# Patient Record
Sex: Female | Born: 1990 | Race: Black or African American | Hispanic: No | Marital: Single | Smoking: Current every day smoker
Health system: Southern US, Community
[De-identification: ages and names within clinical notes are randomized; demographics above are authoritative.]

## PROBLEM LIST (undated history)

## (undated) ENCOUNTER — Inpatient Hospital Stay (HOSPITAL_COMMUNITY): Payer: Self-pay

## (undated) DIAGNOSIS — O1414 Severe pre-eclampsia complicating childbirth: Secondary | ICD-10-CM

## (undated) DIAGNOSIS — O139 Gestational [pregnancy-induced] hypertension without significant proteinuria, unspecified trimester: Secondary | ICD-10-CM

## (undated) DIAGNOSIS — A749 Chlamydial infection, unspecified: Secondary | ICD-10-CM

## (undated) HISTORY — PX: WISDOM TOOTH EXTRACTION: SHX21

## (undated) HISTORY — DX: Severe pre-eclampsia complicating childbirth: O14.14

---
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
## 2011-01-01 DIAGNOSIS — N739 Female pelvic inflammatory disease, unspecified: Secondary | ICD-10-CM

## 2011-01-01 DIAGNOSIS — A5619 Other chlamydial genitourinary infection: Secondary | ICD-10-CM

## 2011-01-01 DIAGNOSIS — O98319 Other infections with a predominantly sexual mode of transmission complicating pregnancy, unspecified trimester: Secondary | ICD-10-CM

## 2011-01-01 DIAGNOSIS — O10019 Pre-existing essential hypertension complicating pregnancy, unspecified trimester: Secondary | ICD-10-CM

## 2011-01-01 LAB — PROTEIN, URINE, 24 HOUR
Protein, 24H Urine: 91 mg/d (ref 50–100)
Urine Total Volume-UPROT: 825 mL

## 2011-01-01 MED ORDER — LABETALOL HCL 200 MG PO TABS
400.0000 mg | ORAL_TABLET | Freq: Two times a day (BID) | ORAL | Status: DC
  Administered 2011-01-01 – 2011-01-02 (×3): 400 mg via ORAL
  Filled 2011-01-01 (×5): qty 2

## 2011-01-01 MED ORDER — LABETALOL HCL 5 MG/ML IV SOLN
20.0000 mg | Freq: Once | INTRAVENOUS | Status: AC
  Administered 2011-01-01: 20 mg via INTRAVENOUS
  Filled 2011-01-01: qty 4

## 2011-01-01 MED ORDER — ONDANSETRON 4 MG PO TBDP
4.0000 mg | ORAL_TABLET | Freq: Three times a day (TID) | ORAL | Status: DC | PRN
  Administered 2011-01-01: 4 mg via ORAL
  Filled 2011-01-01: qty 1

## 2011-01-01 NOTE — Progress Notes (Signed)
  No complaints of visual change, headache, swelling, with good fetal movement.  Filed Vitals:   01/01/11 0630  BP: 137/71  Pulse: 81  Temp:   Resp:    BP 165-173/80-96  NAD, pleasant Abd gravid not tender, soft Ext tr edema, DTR 1+/4+ Results for orders placed during the hospital encounter of 12/30/10 (from the past 24 hour(s))  CREATININE CLEARANCE, URINE, 24 HOUR     Status: Abnormal   Collection Time   12/31/10  6:52 PM      Component Value Range   Urine Total Volume-CRCL 825     Collection Interval-CRCL 24     Creatinine, Urine 175.49     Creatinine <0.47 (*) 0.50 - 1.10 (mg/dL)   Creatinine, 16X Ur 0960  700 - 1800 (mg/day)   Creatinine Clearance NOT CALCULATED  75 - 115 (mL/min)  PROTEIN, URINE, 24 HOUR     Status: Normal (Preliminary result)   Collection Time   12/31/10  7:00 PM      Component Value Range   Urine Total Volume-UPROT 825     Collection Interval-UPROT 24     Protein, Urine PENDING     Protein, 24H Urine 91  50 - 100 (mg/day)    Imp [redacted]w[redacted]d   HTN, R/O preeclampsia Labetalol incresed to 400mg  BID

## 2011-01-02 MED ORDER — LABETALOL HCL 300 MG PO TABS: 300.0000 mg | ORAL_TABLET | Freq: Three times a day (TID) | ORAL | Status: DC

## 2011-01-02 NOTE — Discharge Summary (Signed)
RN to the bedside to review discharge instructions with patient, pt. Verbalized understanding of discharge instructions.  Prescription given.

## 2011-01-02 NOTE — Discharge Summary (Signed)
Obstetric Discharge Summary Reason for Admission: Bp management , chronic HTN  Rule out superimposed Preeclampsi Prenatal Procedures: Preeclampsia workup Intrapartum Procedures:  Postpartum Procedures: none and n/a Complications-Operative and Postpartum:  Hemoglobin  Date Value Range Status  12/30/2010 11.5* 12.0-15.0 (g/dL) Final     HCT  Date Value Range Status  12/30/2010 34.0* 36.0-46.0 (%) Final    Discharge Diagnoses: pregnancy 36w6 days, Chronic Hypertension. Superimposed preeclampsia, ruled out  Discharge Information: Date: 01/02/2011 Activity: limited activity at home Diet: routine Medications: labetolol 300 mg tid Condition: stable and improved Instructions: Keep appt Wed Sept 5 at [facility] Discharge to: home Follow-up Information    [facility] in 4 days. (come to maternity admissions unit earlier as needed if symptoms worsen)          Undelivered  [facility] V 01/02/2011, 10:15 AM

## 2011-01-02 NOTE — Discharge Summary (Signed)
  See completed discharge note

## 2011-01-02 NOTE — Progress Notes (Signed)
              FERGUSON,JOHN V 01/02/2011,9:46 AM        FACULTY PRACTICE ANTEPARTUM(COMPREHENSIVE) NOTE  Tara Parrish is a 20 y.o. G2P1001 at [redacted]w[redacted]d by LMP who is admitted to rule out superimposed preeclampsia and improve BP control..   Fetal presentation is cephalic. Length of Stay:  3  Days  Subjective: Pt denies h/a  Scotoma, ruq pain or abd pain  Patient reports the fetal movement as active. Patient reports uterine contraction  activity as none. Patient reports  vaginal bleeding as none. Patient describes fluid per vagina as None. Vs range from 101/72  To 153/83 on current doses of labetolol, 400 bid Vitals:  Blood pressure 153/83, pulse 63, temperature 98 F (36.7 C), temperature source Oral, resp. rate 18, height 5\' 5"  (1.651 m), weight 98.431 kg (217 lb), last menstrual period 06/03/2010, SpO2 100.00%. Physical Examination:  General appearance - alert, well appearing, and in no distress, oriented to person, place, and time and well hydrated Mental status - alert, oriented to person, place, and time, normal mood, behavior, speech, dress, motor activity, and thought processes Abdomen - soft, nontender, nondistended, no masses or organomegaly Extremities - peripheral pulses normal, no pedal edema, no clubbing or cyanosis Fundal Height:  size equals dates Pelvic Exam:  normal external genitalia, vulva, vagina, cervix, uterus and adnexa, examination not indicated Cervical Exam: Not evaluated. . Extremities: Homans sign is negative, no sign of DVT and reflexes 1+ with DTRs 1+ bilaterally Membranes:intact  Fetal Monitoring:  Baseline: 140 bpm  Labs:  No results found for this or any previous visit (from the past 24 hour(s)).  Imaging Studies:     Currently EPIC will not allow sonographic studies to automatically populate into notes.  In the meantime, copy and paste results into note or free text.  Medications:  Scheduled    . docusate sodium  100 mg Oral Daily    . labetalol  400 mg Oral BID  . prenatal vitamin w/FE, FA  1 tablet Oral Daily  . sodium chloride  3 mL Intravenous Q12H   I have reviewed the patient's current medications.  ASSESSMENT: Patient Active Problem List  Diagnoses  . Hypertension in pregnancy, antepartum  . Supervision of other high-risk pregnancy  . Chlamydia infection complicating pregnancy    PLAN:  Able to go to outpatient management with tid labetolol, will place on 300 mg tid to level out variation in bp's Weekly 24 hr urine for TP, NST biweekly    FERGUSON,JOHN V 01/02/2011,9:46 AM

## 2011-01-08 ENCOUNTER — Telehealth: Payer: Self-pay | Admitting: *Deleted

## 2011-01-08 NOTE — Telephone Encounter (Signed)
Per [facility] no name was labeled on group b strep culture so needs to re-collected at next visit.

## 2011-01-13 ENCOUNTER — Other Ambulatory Visit: Payer: Self-pay | Admitting: Obstetrics and Gynecology

## 2011-01-13 ENCOUNTER — Ambulatory Visit (INDEPENDENT_AMBULATORY_CARE_PROVIDER_SITE_OTHER): Payer: Medicaid Other | Admitting: Obstetrics and Gynecology

## 2011-01-13 VITALS — BP 120/90 | Temp 97.6°F | Wt 222.2 lb

## 2011-01-13 DIAGNOSIS — IMO0002 Reserved for concepts with insufficient information to code with codable children: Secondary | ICD-10-CM

## 2011-01-13 DIAGNOSIS — Z3480 Encounter for supervision of other normal pregnancy, unspecified trimester: Secondary | ICD-10-CM

## 2011-01-13 DIAGNOSIS — O139 Gestational [pregnancy-induced] hypertension without significant proteinuria, unspecified trimester: Secondary | ICD-10-CM

## 2011-01-13 DIAGNOSIS — Z113 Encounter for screening for infections with a predominantly sexual mode of transmission: Secondary | ICD-10-CM

## 2011-01-13 LAB — POCT URINALYSIS DIP (DEVICE)
Ketones, ur: NEGATIVE mg/dL
Protein, ur: NEGATIVE mg/dL
Urobilinogen, UA: 2 mg/dL — ABNORMAL HIGH (ref 0.0–1.0)

## 2011-01-13 NOTE — Patient Instructions (Signed)
Pregnancy - Third Trimester The third trimester begins at the 28th week of pregnancy and ends at birth. It is important to follow your doctor's instructions. HOME CARE  Keep your doctor's appointments.   Do not smoke.   Do not drink alcohol or use drugs.   Only take medicine the doctor tells you to take.   Take prenatal vitamins as directed. The vitamin should contain 1 milligram of folic acid.   Exercise.   Eat healthy foods. Eat regular, well-balanced meals.   You can have sex (intercourse) if there are no other problems with the pregnancy.   Do not use hot tubs, steam rooms, or saunas.   Wear a seat belt while driving.   Avoid raw meat, uncooked cheese, and litter boxes and soil used by cats.   Rest with your legs raised (elevated).   Make a list of emergency phone numbers. Keep this list with you.   Arrange for help when you come back home after delivering the baby.   Make a trial run to the hospital.   Take prenatal classes.   Prepare the baby's nursery.   Do not travel out of the city. If you absolutely have to, get permission from your doctor first.   Wear flat shoes. Do not wear high heels.  GET HELP IF:  You have any concerns or worries during your pregnancy.  GET HELP RIGHT AWAY IF:  You have a temperature by mouth above 100.5, not controlled by medicine.   You have not felt the baby move for more than 1 hour. If you think the baby is not moving as much as normal, eat something with sugar in it or lie down on your left side for an hour. The baby should move at least 4 to 5 times per hour.   Fluid is coming from the vagina.   Blood is coming from the vagina. Light spotting is common, especially after sex (intercourse).   You have belly (abdominal) pain.   You have a bad smelling fluid (discharge) coming from the vagina. The fluid changes from clear to white.   You still feel sick to your stomach (nauseous).   You throw up (vomit) more than 24  hours.   You have the chills.   You have shortness of breath.   You have a burning feeling when you pee (urinate).   You loose or gain more than 2 pounds (0.9 kilograms) of weight over a weeks time, or as suggested by your doctor.   Your face, hands, feet, or legs get puffy (swell).   You have a bad headache that will not go away.   You start to have problems seeing (blurry or double vision).   You fall, are in a car accident, or have any kind of trauma.   There is mental or physical violence at home.  MAKE SURE YOU:   Understand these instructions.   Will watch your condition.   Will get help right away if you are not doing well or get worse.  Document Released: 07/14/2009  [facility] Patient Information 2011 [place], [place].

## 2011-01-13 NOTE — Progress Notes (Signed)
Addended by: Lynnell Dike on: 01/13/2011 02:16 PM   Modules accepted: Orders

## 2011-01-13 NOTE — Progress Notes (Signed)
20 year old gravida 2 para 1001 at 73 and 3 will 38 weeks 3 days gestation. Her only complaints of pelvic pressure she denies headache visual disturbances. Has been diagnosed with PIH. Previous pregnancy she was induced at 37 weeks for preeclampsia.  The cervix was 1/2 cm dilated 40% effaced the vertex at station -1. Labor precautions discussed. Patient given appointment for one week.

## 2011-01-13 NOTE — Progress Notes (Deleted)
Pain/pressure-pelvic. Pulse- 60. Cloudy,white discharge.  Recheck BP 180/112

## 2011-01-14 LAB — CBC
MCH: 29.7 pg (ref 26.0–34.0)
MCHC: 33.2 g/dL (ref 30.0–36.0)
MCV: 89.3 fL (ref 78.0–100.0)
Platelets: 346 10*3/uL (ref 150–400)
RDW: 14 % (ref 11.5–15.5)
WBC: 10.2 10*3/uL (ref 4.0–10.5)

## 2011-01-14 LAB — COMPREHENSIVE METABOLIC PANEL
ALT: 13 U/L (ref 0–35)
AST: 22 U/L (ref 0–37)
Alkaline Phosphatase: 190 U/L — ABNORMAL HIGH (ref 39–117)
Creat: 0.53 mg/dL (ref 0.50–1.10)
Total Bilirubin: 0.3 mg/dL (ref 0.3–1.2)

## 2011-01-14 LAB — CREATININE CLEARANCE, URINE, 24 HOUR
Creatinine, 24H Ur: 1366 mg/d (ref 700–1800)
Creatinine, Urine: 182.1 mg/dL

## 2011-01-15 LAB — STREP B DNA PROBE: GBSP: NEGATIVE

## 2011-01-20 ENCOUNTER — Ambulatory Visit (INDEPENDENT_AMBULATORY_CARE_PROVIDER_SITE_OTHER): Payer: Medicaid Other | Admitting: Obstetrics and Gynecology

## 2011-01-20 ENCOUNTER — Other Ambulatory Visit: Payer: Self-pay | Admitting: Obstetrics and Gynecology

## 2011-01-20 VITALS — BP 176/99 | Temp 97.8°F | Wt 228.8 lb

## 2011-01-20 DIAGNOSIS — Z3480 Encounter for supervision of other normal pregnancy, unspecified trimester: Secondary | ICD-10-CM

## 2011-01-20 LAB — POCT URINALYSIS DIP (DEVICE)
Bilirubin Urine: NEGATIVE
Glucose, UA: NEGATIVE mg/dL
Nitrite: NEGATIVE
Urobilinogen, UA: 0.2 mg/dL (ref 0.0–1.0)

## 2011-01-20 NOTE — Progress Notes (Signed)
P=66, c/o pelvic pressure slightly more intense than usual, c/o contractions occasionally- sometimes wake her up from sleep, states did not take BP medicine this morning, because hasn't eaten yet and gets nauseated when takes it on empty stomach, noted 6 lb weight gain,c/o headache 2 days ago that lasted all day- took 1 tylenol with relief briefly, denies visual changes

## 2011-01-20 NOTE — Progress Notes (Signed)
This patient is an 20 year old prima gravida 39 weeks 3 days will get an NST within next visit in 1 week and no schedule her then for her induction. We've discussed labor what to look for when to come in. Also other reasons to come in to broken water bag or heavy bleeding. This has been a normal pregnancy in the patient has no complaints except for increasing pelvic pressure.

## 2011-01-22 ENCOUNTER — Encounter (HOSPITAL_COMMUNITY): Payer: Self-pay | Admitting: Anesthesiology

## 2011-01-22 ENCOUNTER — Telehealth (HOSPITAL_COMMUNITY): Payer: Self-pay | Admitting: *Deleted

## 2011-01-22 ENCOUNTER — Inpatient Hospital Stay (HOSPITAL_COMMUNITY)
Admission: AD | Admit: 2011-01-22 | Discharge: 2011-01-24 | DRG: 774 | Disposition: A | Discharge status: Home / Self Care | Payer: Medicaid Other | Source: Ambulatory Visit | Attending: Obstetrics and Gynecology | Admitting: Obstetrics and Gynecology

## 2011-01-22 ENCOUNTER — Encounter (HOSPITAL_COMMUNITY): Payer: Self-pay | Admitting: *Deleted

## 2011-01-22 ENCOUNTER — Inpatient Hospital Stay (HOSPITAL_COMMUNITY): Payer: Medicaid Other | Admitting: Anesthesiology

## 2011-01-22 DIAGNOSIS — O1002 Pre-existing essential hypertension complicating childbirth: Secondary | ICD-10-CM

## 2011-01-22 DIAGNOSIS — O0930 Supervision of pregnancy with insufficient antenatal care, unspecified trimester: Secondary | ICD-10-CM

## 2011-01-22 DIAGNOSIS — O98819 Other maternal infectious and parasitic diseases complicating pregnancy, unspecified trimester: Secondary | ICD-10-CM

## 2011-01-22 DIAGNOSIS — O169 Unspecified maternal hypertension, unspecified trimester: Secondary | ICD-10-CM

## 2011-01-22 LAB — URINALYSIS, ROUTINE W REFLEX MICROSCOPIC
Glucose, UA: NEGATIVE mg/dL
Ketones, ur: 15 mg/dL — AB
Leukocytes, UA: NEGATIVE
Nitrite: NEGATIVE
Specific Gravity, Urine: 1.025 (ref 1.005–1.030)
pH: 6.5 (ref 5.0–8.0)

## 2011-01-22 LAB — CBC
HCT: 34 % — ABNORMAL LOW (ref 36.0–46.0)
Hemoglobin: 11.9 g/dL — ABNORMAL LOW (ref 12.0–15.0)
Hemoglobin: 12 g/dL (ref 12.0–15.0)
Hemoglobin: 11.6 g/dL — ABNORMAL LOW (ref 12.0–15.0)
MCHC: 34.3 g/dL (ref 30.0–36.0)
MCV: 88.1 fL (ref 78.0–100.0)
Platelets: 298 10*3/uL (ref 150–400)
Platelets: 301 10*3/uL (ref 150–400)
RBC: 3.91 MIL/uL (ref 3.87–5.11)
RBC: 3.96 MIL/uL (ref 3.87–5.11)
RBC: 3.86 MIL/uL — ABNORMAL LOW (ref 3.87–5.11)
WBC: 14.3 10*3/uL — ABNORMAL HIGH (ref 4.0–10.5)
WBC: 20 10*3/uL — ABNORMAL HIGH (ref 4.0–10.5)

## 2011-01-22 LAB — PROTEIN / CREATININE RATIO, URINE: Protein Creatinine Ratio: 0.07 (ref 0.00–0.15)

## 2011-01-22 LAB — COMPREHENSIVE METABOLIC PANEL
ALT: 17 U/L (ref 0–35)
AST: 23 U/L (ref 0–37)
Alkaline Phosphatase: 210 U/L — ABNORMAL HIGH (ref 39–117)
CO2: 23 mEq/L (ref 19–32)
GFR calc Af Amer: >60 mL/min (ref >60)
Glucose, Bld: 96 mg/dL (ref 70–99)
Potassium: 3.7 mEq/L (ref 3.5–5.1)
Sodium: 137 mEq/L (ref 135–145)
Total Protein: 6.7 g/dL (ref 6.0–8.3)

## 2011-01-22 MED ORDER — PHENYLEPHRINE 40 MCG/ML (10ML) SYRINGE FOR IV PUSH (FOR BLOOD PRESSURE SUPPORT)
80.0000 ug | PREFILLED_SYRINGE | INTRAVENOUS | Status: DC | PRN
  Filled 2011-01-22 (×2): qty 5

## 2011-01-22 MED ORDER — MISOPROSTOL 25 MCG QUARTER TABLET
25.0000 ug | ORAL_TABLET | ORAL | Status: DC | PRN
  Administered 2011-01-22: 25 ug via VAGINAL
  Filled 2011-01-22: qty 1
  Filled 2011-01-22: qty 0.25

## 2011-01-22 MED ORDER — LIDOCAINE HCL 1.5 % IJ SOLN
INTRAMUSCULAR | Status: DC | PRN
  Administered 2011-01-22 (×2): 5 mL via EPIDURAL

## 2011-01-22 MED ORDER — LABETALOL HCL 300 MG PO TABS
300.0000 mg | ORAL_TABLET | Freq: Three times a day (TID) | ORAL | Status: DC
  Administered 2011-01-22 – 2011-01-24 (×7): 300 mg via ORAL
  Filled 2011-01-22 (×11): qty 1

## 2011-01-22 MED ORDER — FERROUS SULFATE 325 (65 FE) MG PO TABS
325.0000 mg | ORAL_TABLET | Freq: Two times a day (BID) | ORAL | Status: DC
  Administered 2011-01-22 – 2011-01-24 (×4): 325 mg via ORAL
  Filled 2011-01-22 (×4): qty 1

## 2011-01-22 MED ORDER — ONDANSETRON HCL 4 MG PO TABS: 4.0000 mg | ORAL_TABLET | ORAL | Status: DC | PRN

## 2011-01-22 MED ORDER — FLEET ENEMA 7-19 GM/118ML RE ENEM: 1.0000 | ENEMA | RECTAL | Status: DC | PRN

## 2011-01-22 MED ORDER — ONDANSETRON HCL 4 MG/2ML IJ SOLN
4.0000 mg | Freq: Four times a day (QID) | INTRAMUSCULAR | Status: DC | PRN
  Administered 2011-01-22: 4 mg via INTRAVENOUS
  Filled 2011-01-22: qty 2

## 2011-01-22 MED ORDER — IBUPROFEN 600 MG PO TABS
600.0000 mg | ORAL_TABLET | Freq: Four times a day (QID) | ORAL | Status: DC | PRN
  Filled 2011-01-22: qty 1

## 2011-01-22 MED ORDER — BENZOCAINE-MENTHOL 20-0.5 % EX AERO
1.0000 "application " | INHALATION_SPRAY | CUTANEOUS | Status: DC | PRN
  Administered 2011-01-23: 1 via TOPICAL

## 2011-01-22 MED ORDER — TETANUS-DIPHTH-ACELL PERTUSSIS 5-2.5-18.5 LF-MCG/0.5 IM SUSP
0.5000 mL | Freq: Once | INTRAMUSCULAR | Status: DC
  Filled 2011-01-22: qty 0.5

## 2011-01-22 MED ORDER — PHENYLEPHRINE 40 MCG/ML (10ML) SYRINGE FOR IV PUSH (FOR BLOOD PRESSURE SUPPORT)
80.0000 ug | PREFILLED_SYRINGE | INTRAVENOUS | Status: DC | PRN
  Filled 2011-01-22: qty 5

## 2011-01-22 MED ORDER — OXYTOCIN BOLUS FROM INFUSION
500.0000 mL | Freq: Once | INTRAVENOUS | Status: DC
  Filled 2011-01-22: qty 1000
  Filled 2011-01-22: qty 500

## 2011-01-22 MED ORDER — DIBUCAINE 1 % RE OINT: 1.0000 "application " | TOPICAL_OINTMENT | RECTAL | Status: DC | PRN

## 2011-01-22 MED ORDER — LACTATED RINGERS IV SOLN: 500.0000 mL | Freq: Once | INTRAVENOUS | Status: DC

## 2011-01-22 MED ORDER — HYDRALAZINE HCL 20 MG/ML IJ SOLN
10.0000 mg | INTRAMUSCULAR | Status: DC | PRN
  Administered 2011-01-22: 10 mg via INTRAVENOUS
  Filled 2011-01-22: qty 1

## 2011-01-22 MED ORDER — DIPHENHYDRAMINE HCL 25 MG PO CAPS: 25.0000 mg | ORAL_CAPSULE | Freq: Four times a day (QID) | ORAL | Status: DC | PRN

## 2011-01-22 MED ORDER — OXYCODONE-ACETAMINOPHEN 5-325 MG PO TABS
1.0000 | ORAL_TABLET | ORAL | Status: DC | PRN
  Administered 2011-01-22: 1 via ORAL
  Filled 2011-01-22: qty 1

## 2011-01-22 MED ORDER — IBUPROFEN 600 MG PO TABS
600.0000 mg | ORAL_TABLET | Freq: Four times a day (QID) | ORAL | Status: DC
  Administered 2011-01-22 – 2011-01-24 (×7): 600 mg via ORAL
  Filled 2011-01-22 (×7): qty 1

## 2011-01-22 MED ORDER — SIMETHICONE 80 MG PO CHEW: 80.0000 mg | CHEWABLE_TABLET | ORAL | Status: DC | PRN

## 2011-01-22 MED ORDER — PRENATAL PLUS 27-1 MG PO TABS
1.0000 | ORAL_TABLET | Freq: Every day | ORAL | Status: DC
  Administered 2011-01-23 – 2011-01-24 (×2): 1 via ORAL
  Filled 2011-01-22 (×2): qty 1

## 2011-01-22 MED ORDER — WITCH HAZEL-GLYCERIN EX PADS: 1.0000 "application " | MEDICATED_PAD | CUTANEOUS | Status: DC | PRN

## 2011-01-22 MED ORDER — LACTATED RINGERS IV SOLN
INTRAVENOUS | Status: DC
  Administered 2011-01-22: 17:00:00 via INTRAVENOUS

## 2011-01-22 MED ORDER — OXYCODONE-ACETAMINOPHEN 5-325 MG PO TABS: 2.0000 | ORAL_TABLET | ORAL | Status: DC | PRN

## 2011-01-22 MED ORDER — ZOLPIDEM TARTRATE 5 MG PO TABS: 5.0000 mg | ORAL_TABLET | Freq: Every evening | ORAL | Status: DC | PRN

## 2011-01-22 MED ORDER — CITRIC ACID-SODIUM CITRATE 334-500 MG/5ML PO SOLN: 30.0000 mL | ORAL | Status: DC | PRN

## 2011-01-22 MED ORDER — ONDANSETRON HCL 4 MG/2ML IJ SOLN: 4.0000 mg | INTRAMUSCULAR | Status: DC | PRN

## 2011-01-22 MED ORDER — EPHEDRINE 5 MG/ML INJ
10.0000 mg | INTRAVENOUS | Status: DC | PRN
  Filled 2011-01-22 (×2): qty 4

## 2011-01-22 MED ORDER — MAGNESIUM SULFATE BOLUS VIA INFUSION
4.0000 g | Freq: Once | INTRAVENOUS | Status: AC
  Administered 2011-01-22: 4 g via INTRAVENOUS
  Filled 2011-01-22: qty 500

## 2011-01-22 MED ORDER — SENNOSIDES-DOCUSATE SODIUM 8.6-50 MG PO TABS
2.0000 | ORAL_TABLET | Freq: Every day | ORAL | Status: DC
  Administered 2011-01-22: 2 via ORAL

## 2011-01-22 MED ORDER — MAGNESIUM SULFATE 40 G IN LACTATED RINGERS - SIMPLE
2.0000 g/h | Freq: Once | INTRAVENOUS | Status: DC
  Filled 2011-01-22: qty 500

## 2011-01-22 MED ORDER — MISOPROSTOL 200 MCG PO TABS
1000.0000 ug | ORAL_TABLET | Freq: Once | ORAL | Status: DC
Start: 2011-01-22 — End: 2011-01-22

## 2011-01-22 MED ORDER — ACETAMINOPHEN 325 MG PO TABS: 650.0000 mg | ORAL_TABLET | ORAL | Status: DC | PRN

## 2011-01-22 MED ORDER — LACTATED RINGERS IV SOLN
INTRAVENOUS | Status: DC
  Administered 2011-01-22: 500 mL via INTRAVENOUS
  Administered 2011-01-22 (×2): via INTRAVENOUS

## 2011-01-22 MED ORDER — DIPHENHYDRAMINE HCL 50 MG/ML IJ SOLN: 12.5000 mg | INTRAMUSCULAR | Status: DC | PRN

## 2011-01-22 MED ORDER — LACTATED RINGERS IV SOLN: 500.0000 mL | INTRAVENOUS | Status: DC | PRN

## 2011-01-22 MED ORDER — LIDOCAINE HCL (PF) 1 % IJ SOLN
30.0000 mL | INTRAMUSCULAR | Status: DC | PRN
  Filled 2011-01-22 (×2): qty 30

## 2011-01-22 MED ORDER — OXYTOCIN 20 UNITS IN LACTATED RINGERS INFUSION - SIMPLE
125.0000 mL/h | Freq: Once | INTRAVENOUS | Status: DC
  Administered 2011-01-22: 999 mL/h via INTRAVENOUS

## 2011-01-22 MED ORDER — NALBUPHINE SYRINGE 5 MG/0.5 ML
10.0000 mg | INJECTION | INTRAMUSCULAR | Status: DC | PRN
  Administered 2011-01-22 (×2): 10 mg via INTRAVENOUS
  Filled 2011-01-22 (×3): qty 0.5
  Filled 2011-01-22: qty 1
  Filled 2011-01-22: qty 0.5

## 2011-01-22 MED ORDER — MISOPROSTOL 200 MCG PO TABS
ORAL_TABLET | ORAL | Status: AC
  Administered 2011-01-22: 1000 ug via RECTAL
  Filled 2011-01-22: qty 5

## 2011-01-22 MED ORDER — TERBUTALINE SULFATE 1 MG/ML IJ SOLN: 0.2500 mg | Freq: Once | INTRAMUSCULAR | Status: DC | PRN

## 2011-01-22 MED ORDER — LABETALOL HCL 100 MG PO TABS
300.0000 mg | ORAL_TABLET | Freq: Three times a day (TID) | ORAL | Status: DC
  Filled 2011-01-22: qty 3

## 2011-01-22 MED ORDER — LANOLIN HYDROUS EX OINT: TOPICAL_OINTMENT | CUTANEOUS | Status: DC | PRN

## 2011-01-22 MED ORDER — MAGNESIUM SULFATE 40 G IN LACTATED RINGERS - SIMPLE
2.0000 g/h | INTRAVENOUS | Status: AC
  Administered 2011-01-22 (×2): 2 g/h via INTRAVENOUS
  Filled 2011-01-22 (×2): qty 500

## 2011-01-22 MED ORDER — FENTANYL 2.5 MCG/ML BUPIVACAINE 1/10 % EPIDURAL INFUSION (WH - ANES)
14.0000 mL/h | INTRAMUSCULAR | Status: DC
  Administered 2011-01-22: 14 mL/h via EPIDURAL
  Filled 2011-01-22: qty 60

## 2011-01-22 MED ORDER — EPHEDRINE 5 MG/ML INJ
10.0000 mg | INTRAVENOUS | Status: DC | PRN
  Filled 2011-01-22: qty 4

## 2011-01-22 NOTE — Progress Notes (Signed)
Tara Parrish is a 20 y.o. G2P1001 at [redacted]w[redacted]d by ultrasound admitted for induction of labor due to Hypertension.  Subjective: Pt wants an epidural.  Currently waiting on platelets to come back.  SROM with light meconium at 9am.  Objective: BP 160/95  Pulse 94  Temp(Src) 98.3 F (36.8 C) (Oral)  Resp 22  Ht 5\' 5"  (1.651 m)  Wt 224 lb 6.4 oz (101.787 kg)  BMI 37.34 kg/m2  SpO2 99%  LMP 06/03/2010 I/O last 3 completed shifts: In: 705.8 [P.O.:240; I.V.:465.8] Out: 350 [Urine:350] Total I/O In: 250 [I.V.:250] Out: 200 [Urine:200]  FHT:  FHR: 125 bpm, variability: moderate,  accelerations:  Abscent,  decelerations:  Present mild variables UC:   regular, every 2 minutes SVE:   5-6/100/-1  Labs: Lab Results  Component Value Date   WBC 13.6* 01/22/2011   HGB 11.9* 01/22/2011   HCT 34.7* 01/22/2011   MCV 88.7 01/22/2011   PLT 298 01/22/2011    Assessment / Plan: Induction of labor due to severe range hypertension,  progressing after cytotec x1  Labor: Progressing normally Preeclampsia:  on magnesium sulfate and no signs or symptoms of toxicity, labetalol 300mg  BID Fetal Wellbeing:  Category II Pain Control:  nubain, planning on epidural I/D:  n/a Anticipated MOD:  NSVD  BOOTH, Latonyia Lopata 01/22/2011, 9:47 AM

## 2011-01-22 NOTE — Progress Notes (Signed)
Pt G2 P0 at 39.5wks, having contractions and passing mucous.  Pt reports elevated BP during pregnancy-taking labetalol three times a day.

## 2011-01-22 NOTE — H&P (Addendum)
Tara Parrish is a 20 y.o. female presenting for passage of mucous plug. Patient reports presence of mild contractions, denies leakage of fluid or vaginal bleeding. Upon admission, patient with elevated blood pressure. Denies headache, visual disturbances, RUQ/epigastric pain, nausea or emesis. Patient initiated [facility] at 35 wks at [facility] clinic with pregnancy complicated with CHTN/Gestational hypertension and positive chlamydia culture.  Patient with h/o preeclampsia at 36 weeks for which she was induced during her last pregnancy.   Maternal Medical History:  Reason for admission: Reason for admission: contractions.  Elevated blood pressure  Contractions: Onset was 1-2 hours ago.   Frequency: regular.    Fetal activity: Perceived fetal activity is normal.    Prenatal complications: Hypertension.   Late to care    OB History    Grav Para Term Preterm Abortions TAB SAB Ect Mult Living   2 1 1  0 0 0 0 0 0 1     Past Medical History  Diagnosis Date  . Pregnancy induced hypertension   . Chlamydia 11/2010    treated   Past Surgical History  Procedure Date  . No past surgeries    Family History: family history is not on file. Social History:  reports that she quit smoking about 4 months ago. She has never used smokeless tobacco. She reports that she does not drink alcohol or use illicit drugs.  ROS Negative   Blood pressure 155/95, pulse 81, temperature 98.3 F (36.8 C), temperature source Oral, resp. rate 20, height 5\' 5"  (1.651 m), weight 101.787 kg (224 lb 6.4 oz), last menstrual period 06/03/2010. Maternal Exam:  Uterine Assessment: Contraction strength is mild.  Contraction frequency is regular.   Abdomen: Patient reports no abdominal tenderness.   Fetal Exam Fetal Monitor Review: Baseline rate: 120.  Variability: moderate (6-25 bpm).   Pattern: accelerations present.    Fetal State Assessment: Category I - tracings are normal.     Physical Exam  Prenatal  labs: ABO, Rh: --/--/B POS (08/06 1740) Antibody: NEG (08/06 1740) Rubella: 47.7 (08/06 1740) RPR: NON REACTIVE (08/06 1740)  HBsAg: NEGATIVE (08/06 1740)  HIV: NON REACTIVE (08/06 1740)  GBS: NEGATIVE (09/12 1014)   Assessment/Plan: 19yo G2P1001 at [redacted]w[redacted]d with preeclampsia and normal labs - tracing category I - will start magnesium sulfate for seizure prophylaxis - will start induction of labor (cervical exam to be determined in birthing suite)   Laurali Goddard 01/22/2011, 3:31 AM

## 2011-01-22 NOTE — Anesthesia Preprocedure Evaluation (Signed)
Anesthesia Evaluation  Name, MR# and DOB Patient awake  General Assessment Comment  Reviewed: Allergy & Precautions, H&P , Patient's Chart, lab work & pertinent test results  Airway Mallampati: II TM Distance: >3 FB Neck ROM: full    Dental No notable dental hx.    Pulmonary  clear to auscultation  pulmonary exam normalPulmonary Exam Normal breath sounds clear to auscultation none    Cardiovascular hypertension, regular Normal    Neuro/Psych Negative Neurological ROS  Negative Psych ROS  GI/Hepatic/Renal negative GI ROS  negative Liver ROS  negative Renal ROS        Endo/Other  Negative Endocrine ROS (+)   Morbid obesity  Abdominal   Musculoskeletal   Hematology negative hematology ROS (+)   Peds  Reproductive/Obstetrics (+) Pregnancy    Anesthesia Other Findings             Anesthesia Physical Anesthesia Plan  ASA: III  Anesthesia Plan: Epidural   Post-op Pain Management:    Induction:   Airway Management Planned:   Additional Equipment:   Intra-op Plan:   Post-operative Plan:   Informed Consent: I have reviewed the patients History and Physical, chart, labs and discussed the procedure including the risks, benefits and alternatives for the proposed anesthesia with the patient or authorized representative who has indicated his/her understanding and acceptance.     Plan Discussed with:   Anesthesia Plan Comments:         Anesthesia Quick Evaluation

## 2011-01-22 NOTE — Anesthesia Procedure Notes (Signed)
Epidural Patient location during procedure: OB Start time: 01/22/2011 10:12 AM  Staffing Performed by: anesthesiologist   Preanesthetic Checklist Completed: patient identified, site marked, surgical consent, pre-op evaluation, timeout performed, IV checked, risks and benefits discussed and monitors and equipment checked  Epidural Patient position: sitting Prep: site prepped and draped and DuraPrep Patient monitoring: continuous pulse ox and blood pressure Approach: midline Injection technique: LOR air and LOR saline  Needle:  Needle type: Tuohy  Needle gauge: 17 G Needle length: 9 cm Needle insertion depth: 9 cm Catheter type: closed end flexible Catheter size: 19 Gauge Catheter at skin depth: 14 cm Test dose: negative  Assessment Events: blood not aspirated, injection not painful, no injection resistance, negative IV test and no paresthesia  Additional Notes Patient identified.  Risk benefits discussed including failed block, incomplete pain control, headache, nerve damage, paralysis, blood pressure changes, nausea, vomiting, reactions to medication both toxic or allergic, and postpartum back pain.  Patient expressed understanding and wished to proceed.  All questions were answered.  Sterile technique used throughout procedure and epidural site dressed with sterile barrier dressing. No paresthesia or other complications noted.The patient did not experience any signs of intravascular injection such as tinnitus or metallic taste in mouth nor signs of intrathecal spread such as rapid motor block. Please see nursing notes for vital signs.

## 2011-01-22 NOTE — Progress Notes (Signed)
Dr. Adrian Blackwater notified of pt status, pain level and blood pressures. Orders received will continue to monitor.

## 2011-01-22 NOTE — Progress Notes (Signed)
Pt reports "tightening" and some mucus vaginal discharge.

## 2011-01-22 NOTE — Progress Notes (Signed)
Tara Parrish is a 20 y.o. G2P1001 at [redacted]w[redacted]d by LMP admitted for induction of labor due to Hypertension.  Subjective: Patient having irregular contractions.    Objective: BP 137/67  Pulse 77  Temp(Src) 98.3 F (36.8 C) (Oral)  Resp 18  Ht 5\' 5"  (1.651 m)  Wt 101.787 kg (224 lb 6.4 oz)  BMI 37.34 kg/m2  SpO2 99%  LMP 06/03/2010   Total I/O In: 78.3 [I.V.:78.3] Out: -   FHT:  FHR: 130s bpm, variability: moderate,  accelerations:  Present,  decelerations:  Absent UC:   irregular, every 2-6 minutes SVE:   Dilation: 1 Effacement (%): 20 Station: -3 Exam by:: Dr. Adrian Blackwater  Labs: Lab Results  Component Value Date   WBC 13.6* 01/22/2011   HGB 11.9* 01/22/2011   HCT 34.7* 01/22/2011   MCV 88.7 01/22/2011   PLT 298 01/22/2011    Assessment / Plan: IOL for GHTN  Magnesium sulfate bolus running.  Will give scheduled labetalol.  Will start misoprostol for ripening.  STINSON, JACOB JEHIEL 01/22/2011, 5:10 AM

## 2011-01-22 NOTE — Progress Notes (Signed)
Dr. Jolayne Panther called. Orders given for Magnesium.  See orders.

## 2011-01-23 MED ORDER — SODIUM CHLORIDE 0.9 % IJ SOLN
3.0000 mL | Freq: Two times a day (BID) | INTRAMUSCULAR | Status: DC
  Administered 2011-01-23: 3 mL via INTRAVENOUS

## 2011-01-23 MED ORDER — BENZOCAINE-MENTHOL 20-0.5 % EX AERO
INHALATION_SPRAY | CUTANEOUS | Status: AC
  Filled 2011-01-23: qty 56

## 2011-01-23 MED ORDER — LACTATED RINGERS IV SOLN
INTRAVENOUS | Status: DC
  Administered 2011-01-23: 04:00:00 via INTRAVENOUS

## 2011-01-23 NOTE — Plan of Care (Signed)
Problem: Discharge Progression Outcomes Goal: Barriers To Progression Addressed/Resolved Outcome: Progressing PIH/elevated BP's on magnesium

## 2011-01-23 NOTE — Progress Notes (Signed)
Subjective: Interval History: none.  No headache, bl;urry vision, feels good very perky  Objective: Vital signs in last 24 hours: Temp:  [97.5 F (36.4 C)-98.7 F (37.1 C)] 97.9 F (36.6 C) (09/22 0400) Pulse Rate:  [66-117] 88  (09/22 0400) Resp:  [18-22] 20  (09/22 0600) BP: (112-194)/(52-183) 156/94 mmHg (09/22 0600) SpO2:  [86 %-100 %] 99 % (09/22 0400) Weight:  [213 lb 3.2 oz (96.707 kg)-217 lb 4.8 oz (98.567 kg)] 213 lb 3.2 oz (96.707 kg) (09/22 0600)  Intake/Output from previous day: 09/21 0701 - 09/22 0700 In: 3174.5 [P.O.:1040; I.V.:2134.5] Out: 5851 [Urine:5401] -2700 cc Intake/Output this shift:    BP 156/94  Pulse 88  Temp(Src) 97.9 F (36.6 C) (Oral)  Resp 20  Ht 5\' 5"  (1.651 m)  Wt 213 lb 3.2 oz (96.707 kg)  BMI 35.48 kg/m2  SpO2 99%  LMP 06/03/2010  Breastfeeding? Unknown                          Lungs:     Clear to auscultation bilaterally, respirations unlabored      Heart:    Regular rate and rhythm, S1 and S2 normal, no murmur, rub   or gallop     Abdomen:     Soft, non-tender, bowel sounds active all four quadrants,    no masses, no organomegaly        Extremities:   Extremities normal, atraumatic, no cyanosis or edema  Pulses:   2+ and symmetric all extremities  Skin:   Skin color, texture, turgor normal, no rashes or lesions          Results for orders placed during the hospital encounter of 01/22/11 (from the past 24 hour(s))  CBC     Status: Abnormal   Collection Time   01/22/11  9:45 AM      Component Value Range   WBC 14.3 (*) 4.0 - 10.5 (K/uL)   RBC 3.96  3.87 - 5.11 (MIL/uL)   Hemoglobin 12.0  12.0 - 15.0 (g/dL)   HCT 82.9 (*) 56.2 - 46.0 (%)   MCV 87.6  78.0 - 100.0 (fL)   MCH 30.3  26.0 - 34.0 (pg)   MCHC 34.6  30.0 - 36.0 (g/dL)   RDW 13.0  86.5 - 78.4 (%)   Platelets 301  150 - 400 (K/uL)  CBC     Status: Abnormal   Collection Time   01/22/11 12:25 PM      Component Value Range   WBC 20.0 (*) 4.0 - 10.5 (K/uL)    RBC 3.86 (*) 3.87 - 5.11 (MIL/uL)   Hemoglobin 11.6 (*) 12.0 - 15.0 (g/dL)   HCT 69.6 (*) 29.5 - 46.0 (%)   MCV 88.1  78.0 - 100.0 (fL)   MCH 30.1  26.0 - 34.0 (pg)   MCHC 34.1  30.0 - 36.0 (g/dL)   RDW 28.4  13.2 - 44.0 (%)   Platelets 289  150 - 400 (K/uL)  MRSA PCR SCREENING     Status: Normal   Collection Time   01/22/11  2:40 PM      Component Value Range   MRSA by PCR NEGATIVE  NEGATIVE     Studies/Results: US Ob Limited  12/30/2010  OBSTETRICAL ULTRASOUND: This exam was performed within a [facility]. The OB US report was generated in the AS system, and faxed to the ordering physician.   This report is also available in [employer] and in the [employer]. See AS Obstetric US report.   US Fetal Bpp W/o Non Stress  12/30/2010  OBSTETRICAL ULTRASOUND: This exam was performed within a [facility]. The OB US report was generated in the AS system, and faxed to the ordering physician.   This report is also available in [employer] and in the [employer]. See AS Obstetric US report.    Scheduled Meds:   . ferrous sulfate  325 mg Oral BID WC  . ibuprofen  600 mg Oral Q6H  . labetalol  300 mg Oral Q8H  . misoprostol      . prenatal vitamin w/FE, FA  1 tablet Oral Daily  . senna-docusate  2 tablet Oral QHS  . DISCONTD: lactated ringers  500 mL Intravenous Once  . DISCONTD: misoprostol  1,000 mcg Rectal Once  . DISCONTD: oxytocin 20 units in LR 1000 mL  500 mL Intravenous Once  . DISCONTD: oxytocin 20 units in LR 1000 mL  125 mL/hr Intravenous Once  . DISCONTD: TDaP  0.5 mL Intramuscular Once   Continuous Infusions:   . lactated ringers 100 mL/hr at 01/23/11 0415  . magnesium sulfate 40 grams in LR 500 mL 2 g/hr (01/23/11 0600)  . DISCONTD: fentaNYL 2.5 mcg/ml/ bupivacaine 1/10% Stopped (01/22/11 1120)  . DISCONTD: lactated ringers 100 mL/hr at 01/22/11 0959  . DISCONTD: lactated ringers  100 mL/hr at 01/22/11 1641   PRN Meds:benzocaine-Menthol, dibucaine, diphenhydrAMINE, lanolin, ondansetron (ZOFRAN) IV, ondansetron, oxyCODONE-acetaminophen, simethicone, witch hazel-glycerin, zolpidem, DISCONTD: acetaminophen, DISCONTD: citric acid-sodium citrate, DISCONTD: diphenhydrAMINE, DISCONTD: ePHEDrine, DISCONTD: ePHEDrine, DISCONTD: hydrALAZINE, DISCONTD: ibuprofen, DISCONTD: lactated ringers, DISCONTD: lidocaine, DISCONTD: misoprostol, DISCONTD: nalbuphine DISCONTD: ondansetron, DISCONTD: oxyCODONE-acetaminophen, DISCONTD: phenylephrine, DISCONTD: phenylephrine, DISCONTD: sodium phosphate, DISCONTD: terbutaline  Assessment/Plan:  Normal Post partum course:  Continue on MgSO4 until 1100 and transfer later today, continue labetalol    LOS: 1 day   EURE,LUTHER H

## 2011-01-23 NOTE — Anesthesia Postprocedure Evaluation (Signed)
  Anesthesia Post-op Note  Patient: Tara Parrish  Procedure(s) Performed: * No procedures listed *  Patient Location: PACU and A-ICU  Anesthesia Type: Epidural  Level of Consciousness: awake, alert  and oriented  Airway and Oxygen Therapy: Patient Spontanous Breathing   Post-op Assessment: Patient's Cardiovascular Status Stable and Respiratory Function Stable  Post-op Vital Signs: stable  Complications: No apparent anesthesia complications

## 2011-01-23 NOTE — Progress Notes (Signed)
9/22 1100 magnesium d/c'd.         1550 SBar report called to [facility], RN - pt for transfer to 134.

## 2011-01-24 MED ORDER — IBUPROFEN 600 MG PO TABS: 600.0000 mg | ORAL_TABLET | Freq: Four times a day (QID) | ORAL | Status: AC

## 2011-01-24 NOTE — Progress Notes (Signed)
Post Partum Day 2 Subjective: no complaints, up ad lib, voiding and tolerating PO  Objective: Blood pressure 159/82, pulse 73, temperature 98.4 F (36.9 C), temperature source Oral, resp. rate 18, height 5\' 5"  (1.651 m), weight 213 lb 3.2 oz (96.707 kg), last menstrual period 06/03/2010, SpO2 94.00%, unknown if currently breastfeeding.  Physical Exam:  General: alert Lochia: appropriate Uterine Fundus: firm Incision: n/a DVT Evaluation: No evidence of DVT seen on physical exam.   Basename 01/22/11 1225 01/22/11 0945  HGB 11.6* 12.0  HCT 34.0* 34.7*    Assessment/Plan: Discharge home and Contraception nexplanon   LOS: 2 days   Lucciana Head H 01/24/2011, 9:11 AM

## 2011-01-24 NOTE — Discharge Summary (Signed)
  Obstetric Discharge Summary Reason for Admission: induction of labor Prenatal Procedures: ultrasound Intrapartum Procedures: spontaneous vaginal delivery Postpartum Procedures: none Complications-Operative and Postpartum: none   Delivery Note At 11:05 AM a viable female was delivered via Vaginal, Spontaneous Delivery (Presentation: Left Occiput Anterior).  APGAR: 9, 9; weight 6 lb 13.2 oz (3095 g).   Placenta status: Intact, Spontaneous.  Cord: 3 vessels with the following complications: None.  Cord pH:   Anesthesia: Epidural  Episiotomy: None Lacerations: 2nd degree;Perineal Suture Repair: none Est. Blood Loss (mL): 450  Mom to postpartum.  Baby to nursery-stable.  Brenden Rudman H 01/24/2011, 9:09 AM     H/H: Lab Results  Component Value Date/Time   HGB 11.6* 01/22/2011 12:25 PM   HCT 34.0* 01/22/2011 12:25 PM      Discharge Diagnoses: Term Pregnancy-delivered  Discharge Information: Date: 11/12/2010 Activity: pelvic rest Diet: routine Medications: Ibuprophen Breast feeding:  No:  Condition: stable Instructions: refer to practice specific booklet Discharge to: home   Ikechukwu Cerny H 01/24/2011,9:09 AM

## 2011-01-26 NOTE — Progress Notes (Signed)
UR Chart review completed.  

## 2011-01-27 ENCOUNTER — Other Ambulatory Visit: Payer: Medicaid Other

## 2011-03-03 ENCOUNTER — Ambulatory Visit: Payer: Medicaid Other | Admitting: Obstetrics and Gynecology

## 2011-09-13 ENCOUNTER — Emergency Department (HOSPITAL_COMMUNITY)
Admission: EM | Admit: 2011-09-13 | Discharge: 2011-09-13 | Disposition: A | Discharge status: Home / Self Care | Payer: Self-pay | Attending: Emergency Medicine | Admitting: Emergency Medicine

## 2011-09-13 ENCOUNTER — Encounter (HOSPITAL_COMMUNITY): Payer: Self-pay

## 2011-09-13 ENCOUNTER — Emergency Department (HOSPITAL_COMMUNITY): Payer: Self-pay

## 2011-09-13 DIAGNOSIS — R1013 Epigastric pain: Secondary | ICD-10-CM | POA: Insufficient documentation

## 2011-09-13 DIAGNOSIS — D72829 Elevated white blood cell count, unspecified: Secondary | ICD-10-CM | POA: Insufficient documentation

## 2011-09-13 DIAGNOSIS — R10816 Epigastric abdominal tenderness: Secondary | ICD-10-CM | POA: Insufficient documentation

## 2011-09-13 DIAGNOSIS — K5900 Constipation, unspecified: Secondary | ICD-10-CM | POA: Insufficient documentation

## 2011-09-13 LAB — COMPREHENSIVE METABOLIC PANEL
ALT: 12 U/L (ref 0–35)
AST: 16 U/L (ref 0–37)
Albumin: 3.8 g/dL (ref 3.5–5.2)
Alkaline Phosphatase: 66 U/L (ref 39–117)
BUN: 6 mg/dL (ref 6–23)
CO2: 24 mEq/L (ref 19–32)
Calcium: 9.3 mg/dL (ref 8.4–10.5)
Chloride: 102 mEq/L (ref 96–112)
Creatinine, Ser: 0.62 mg/dL (ref 0.50–1.10)
GFR calc Af Amer: >90 mL/min (ref >90)
GFR calc non Af Amer: >90 mL/min (ref >90)
Glucose, Bld: 95 mg/dL (ref 70–99)
Potassium: 2.5 mEq/L — CL (ref 3.5–5.1)
Sodium: 139 mEq/L (ref 135–145)
Total Bilirubin: 0.3 mg/dL (ref 0.3–1.2)
Total Protein: 7.7 g/dL (ref 6.0–8.3)

## 2011-09-13 LAB — URINALYSIS, ROUTINE W REFLEX MICROSCOPIC
Glucose, UA: NEGATIVE mg/dL
Ketones, ur: >80 mg/dL — AB
Nitrite: NEGATIVE
Protein, ur: 30 mg/dL — AB
Specific Gravity, Urine: 1.028 (ref 1.005–1.030)
Urobilinogen, UA: 0.2 mg/dL (ref 0.0–1.0)
pH: 6 (ref 5.0–8.0)

## 2011-09-13 LAB — CBC
HCT: 37.4 % (ref 36.0–46.0)
Hemoglobin: 13 g/dL (ref 12.0–15.0)
MCH: 30 pg (ref 26.0–34.0)
MCHC: 34.8 g/dL (ref 30.0–36.0)
MCV: 86.2 fL (ref 78.0–100.0)
Platelets: 260 10*3/uL (ref 150–400)
RBC: 4.34 MIL/uL (ref 3.87–5.11)
RDW: 12.6 % (ref 11.5–15.5)
WBC: 20.3 10*3/uL — ABNORMAL HIGH (ref 4.0–10.5)

## 2011-09-13 LAB — URINE MICROSCOPIC-ADD ON

## 2011-09-13 LAB — LIPASE, BLOOD: Lipase: 14 U/L (ref 11–59)

## 2011-09-13 LAB — PREGNANCY, URINE: Preg Test, Ur: NEGATIVE

## 2011-09-13 MED ORDER — HYDROMORPHONE HCL PF 1 MG/ML IJ SOLN
1.0000 mg | Freq: Once | INTRAMUSCULAR | Status: DC
  Filled 2011-09-13 (×2): qty 1

## 2011-09-13 MED ORDER — HYDROMORPHONE HCL PF 1 MG/ML IJ SOLN
1.0000 mg | Freq: Once | INTRAMUSCULAR | Status: AC
  Administered 2011-09-13: 1 mg via INTRAVENOUS

## 2011-09-13 MED ORDER — FAMOTIDINE 20 MG PO TABS: 20.0000 mg | ORAL_TABLET | Freq: Two times a day (BID) | ORAL | Status: DC

## 2011-09-13 MED ORDER — IOHEXOL 300 MG/ML  SOLN
20.0000 mL | INTRAMUSCULAR | Status: AC
  Administered 2011-09-13 (×2): 20 mL via ORAL

## 2011-09-13 MED ORDER — TRAMADOL HCL 50 MG PO TABS: 50.0000 mg | ORAL_TABLET | Freq: Three times a day (TID) | ORAL | Status: DC | PRN

## 2011-09-13 MED ORDER — GI COCKTAIL ~~LOC~~
10.0000 mL | Freq: Once | ORAL | Status: AC
  Administered 2011-09-13: 10 mL via ORAL
  Filled 2011-09-13: qty 30

## 2011-09-13 MED ORDER — ONDANSETRON HCL 4 MG/2ML IJ SOLN
4.0000 mg | Freq: Once | INTRAMUSCULAR | Status: AC
  Administered 2011-09-13: 4 mg via INTRAVENOUS
  Filled 2011-09-13 (×2): qty 2

## 2011-09-13 MED ORDER — POTASSIUM CHLORIDE CRYS ER 20 MEQ PO TBCR
60.0000 meq | EXTENDED_RELEASE_TABLET | Freq: Once | ORAL | Status: AC
  Administered 2011-09-13: 60 meq via ORAL
  Filled 2011-09-13: qty 3

## 2011-09-13 MED ORDER — SODIUM CHLORIDE 0.9 % IV BOLUS (SEPSIS)
1000.0000 mL | Freq: Once | INTRAVENOUS | Status: AC
  Administered 2011-09-13: 1000 mL via INTRAVENOUS

## 2011-09-13 MED ORDER — IOHEXOL 300 MG/ML  SOLN
75.0000 mL | Freq: Once | INTRAMUSCULAR | Status: AC | PRN
  Administered 2011-09-13: 75 mL via INTRAVENOUS

## 2011-09-13 MED ORDER — ACETAMINOPHEN 325 MG PO TABS
650.0000 mg | ORAL_TABLET | Freq: Once | ORAL | Status: AC
  Administered 2011-09-13: 650 mg via ORAL
  Filled 2011-09-13: qty 2

## 2011-09-13 NOTE — ED Provider Notes (Signed)
History  This chart was scribed for Tara Razor, MD by Bennett Scrape. This patient was seen in room STRE2/STRE2 and the patient's care was started at 10:52AM.  CSN: [id number]  Arrival date & time 09/13/11  8295   First MD Initiated Contact with Patient 09/13/11 1052      Chief Complaint  Patient presents with  . Constipation     The history is provided by the patient. No language interpreter was used.    Tara Parrish is a 21 y.o. female who presents to the Emergency Department complaining of 3 days of gradual onset, gradually worsening, constant upper abdominal pain described as sharp with associated constipation. She denies any modifying factors. She rates her pain a nine out of 10. She reports taking a laxative with mild improvement in symptoms. Last BM was about 4 hours ago and was loose. She also reports taking ibuprofen with no improvement in symptoms. She denies any prior episodes. She denies nausea, emesis, fever and HA as associated symptoms. She has no h/o chronic medical conditions. She is a former smoker but denies alcohol use.   Past Medical History  Diagnosis Date  . Pregnancy induced hypertension   . Chlamydia 11/2010    treated    Past Surgical History  Procedure Date  . No past surgeries     History reviewed. No pertinent family history.  History  Substance Use Topics  . Smoking status: Former Smoker    Quit date: 09/06/2010  . Smokeless tobacco: Never Used  . Alcohol Use: No    OB History    Grav Para Term Preterm Abortions TAB SAB Ect Mult Living   2 2 2  0 0 0 0 0 0 2      Review of Systems  Constitutional: Negative for fever and chills.  Respiratory: Negative for cough and shortness of breath.   Gastrointestinal: Positive for abdominal pain and constipation. Negative for nausea and vomiting.  Neurological: Negative for weakness and headaches.  All other systems reviewed and are negative.    Allergies  Review of patient's allergies  indicates no known allergies.  Home Medications   Current Outpatient Rx  Name Route Sig Dispense Refill  . IBUPROFEN 200 MG PO TABS Oral Take 400-800 mg by mouth every 6 (six) hours as needed. For pain      Triage Vitals: BP 159/92  Pulse 113  Temp(Src) 100.1 F (37.8 C) (Oral)  Resp 18  SpO2 99%  LMP 09/06/2011  Physical Exam  Nursing note and vitals reviewed. Constitutional: She is oriented to person, place, and time. She appears well-developed and well-nourished. No distress.  HENT:  Head: Normocephalic and atraumatic.  Eyes: EOM are normal.  Neck: Neck supple. No tracheal deviation present.  Cardiovascular: Normal rate, regular rhythm and normal heart sounds.   Pulmonary/Chest: Effort normal and breath sounds normal. No respiratory distress.  Abdominal: She exhibits no distension. There is tenderness. There is no rebound and no guarding.       mild to moderate tenderness to the epigastric region, no tenderness across the lower abdomen, no CVA tenderness   Musculoskeletal: Normal range of motion.  Neurological: She is alert and oriented to person, place, and time.  Skin: Skin is warm and dry.  Psychiatric: She has a normal mood and affect. Her behavior is normal.    ED Course  Procedures (including critical care time)  DIAGNOSTIC STUDIES: Oxygen Saturation is 99% on room air, normal by my interpretation.    COORDINATION OF  CARE: 11:34AM-Discussed treatment plan with pt and pt agreed to plan. 1:31PM-Pt rechecked and feels better. Advised pt of lab work results and discussed need for CT scan of abdomen. Pt acknowledged blood work results and agreed to CT scan. 4:26PM-Discussed discharge plan with pt and pt agreed.  Labs Reviewed  URINALYSIS, ROUTINE W REFLEX MICROSCOPIC - Abnormal; Notable for the following:    Color, Urine AMBER (*) BIOCHEMICALS MAY BE AFFECTED BY COLOR   Hgb urine dipstick TRACE (*)    Bilirubin Urine SMALL (*)    Ketones, ur >80 (*)    Protein,  ur 30 (*)    Leukocytes, UA SMALL (*)    All other components within normal limits  COMPREHENSIVE METABOLIC PANEL - Abnormal; Notable for the following:    Potassium 2.5 (*)    All other components within normal limits  CBC - Abnormal; Notable for the following:    WBC 20.3 (*)    All other components within normal limits  URINE MICROSCOPIC-ADD ON - Abnormal; Notable for the following:    Squamous Epithelial / LPF FEW (*)    All other components within normal limits  PREGNANCY, URINE  LIPASE, BLOOD   Ct Abdomen Pelvis W Contrast  09/13/2011  *RADIOLOGY REPORT*  Clinical Data: Three day onset of gradually worsening upper abdominal pain with constipation.  CT ABDOMEN AND PELVIS WITH CONTRAST  Technique:  Multidetector CT imaging of the abdomen and pelvis was performed following the standard protocol during bolus administration of intravenous contrast.  Contrast: 75mL OMNIPAQUE IOHEXOL 300 MG/ML  SOLN  Comparison: None.  Findings: The lung bases are clear.  There is no pleural effusion. The liver, gallbladder, biliary system and pancreas appear normal. The spleen, kidneys and adrenal glands appear normal.  There is no hydronephrosis or perinephric soft tissue stranding.  The bowel gas pattern is normal.  The appendix appears normal.  No inflammatory changes are identified.  The uterus appears normal. There are no suspicious adnexal findings.  There is a small left pelvic calcification on image 73.  The relationship to the distal left ureter is difficult to define, although this is likely a phlebolith.  No osseous abnormalities are seen.  IMPRESSION:  1.  No acute abdominal pelvic findings identified. 2.  Small left pelvic calcification appears to be external to the left ureter and is probably a phlebolith.  No hydronephrosis.  Original Report Authenticated By: Gerrianne Scale, M.D.     1. Abdominal pain       MDM  20yF with epigastric pain. Mild to moderate tenderness in epigastric region  without peritoneal signs. Possible gastritis/PUD. CT preformed primarily because of marked leukocytosis but unrevealing as to etiology. Trial of h2 blocker. LFTs normal. Lipase normal. hypoK which was repleted. Return precautions discussed.      I personally preformed the services scribed in my presence. The recorded information has been reviewed and considered. Tara Razor, MD.    Tara Razor, MD 09/16/11 [phone]

## 2011-09-13 NOTE — Discharge Instructions (Signed)
Abdominal Pain Abdominal pain can be caused by many things. Your caregiver decides the seriousness of your pain by an examination and possibly blood tests and X-rays. Many cases can be observed and treated at home. Most abdominal pain is not caused by a disease and will probably improve without treatment. However, in many cases, more time must pass before a clear cause of the pain can be found. Before that point, it may not be known if you need more testing, or if hospitalization or surgery is needed. HOME CARE INSTRUCTIONS   Do not take laxatives unless directed by your caregiver.   Take pain medicine only as directed by your caregiver.   Only take over-the-counter or prescription medicines for pain, discomfort, or fever as directed by your caregiver.   Try a clear liquid diet (broth, tea, or water) for as long as directed by your caregiver. Slowly move to a bland diet as tolerated.  SEEK IMMEDIATE MEDICAL CARE IF:   The pain does not go away.   You have a fever.   You keep throwing up (vomiting).   The pain is felt only in portions of the abdomen. Pain in the right side could possibly be appendicitis. In an adult, pain in the left lower portion of the abdomen could be colitis or diverticulitis.   You pass bloody or black tarry stools.  MAKE SURE YOU:   Understand these instructions.   Will watch your condition.   Will get help right away if you are not doing well or get worse.  Document Released: 01/27/2005 Document Revised: 04/08/2011 Document Reviewed: 12/06/2007 [employer] Patient Information 2012 [employer]  RESOURCE GUIDE  Dental Problems  Patients with Medicaid: [facility]                     [facility] [address]                                           [address] Phone:  [phone]                                                  Phone:  [phone]  If unable to pay or uninsured, contact:  [facility] or [facility] to become qualified for the adult dental clinic.  Chronic Pain Problems Contact [facility]  [phone] Patients need to be referred by their primary care doctor.  Insufficient Money for Medicine Contact [employer]:  call "211" or [facility] 271-5999.  No Primary Care Doctor Call [facility]  832-8000 Other agencies that provide inexpensive medical care    [facility]  [phone]    [facility]  [phone]    [facility]  [phone]    [facility] Clinic  [phone]    [facility]  [phone]    [facility]  [phone]  Psychological Services [facility]  [phone] [facility]  [phone] [facility]   800 853-5163 (emergency services 641-4993)  Substance Abuse Resources [facility]  336-882-2125 [facility] 336-784-9470 [facility] 336-285-9073 [facility] 336-845-3988 Residential & Outpatient Substance Abuse Program  800-659-3381    Abuse/Neglect [place] Child Abuse Hotline (336) 641-3795 [place] Child Abuse Hotline 800-378-5315 (After Hours)  Emergency Shelter [facility] (336) 271-5985  [facility] (336) 275-9566 [facility] (704) 372-4663  MRSA Hotline #:   832-7006    [place] [facility]  [facility] [place] [facility]     [employer]                          [facility] [address] [place]                       [address]      [address]  [place]                                                [place]                            [place] Phone:  [phone]                                   Phone:  [phone]                 Phone:  [phone]  [facility] Phone:  [phone]  [place] Child Abuse Hotline (336) 342-1394 (336) 342-3537 (After  Hours)   

## 2011-09-13 NOTE — ED Notes (Signed)
Patient continues to drink oral contrast finished one cup currently on second.

## 2011-09-13 NOTE — ED Notes (Signed)
CT gave oral contrast to patient. Patient verbalized understanding.

## 2011-09-13 NOTE — ED Notes (Signed)
Patient transported to CT 

## 2011-09-13 NOTE — ED Notes (Signed)
abd pain, sharp in nature, constipated, sts had a bowel movement and was loose this morning.

## 2011-09-13 NOTE — ED Notes (Signed)
Patient states onset 3 days ago constipated did have bowel movement one day ago and today soft and loose.  Abdominal pain periumbilical diffuse entire abdomen 8-9/10 cramping.  Abdomen soft slight distended.  Resting comfortably on stretcher with own blanket.  No distress noted. Patient sleeping awoke by calling patient's name. Ax4.

## 2011-09-16 ENCOUNTER — Emergency Department (HOSPITAL_COMMUNITY)
Admission: EM | Admit: 2011-09-16 | Discharge: 2011-09-16 | Disposition: A | Discharge status: Home / Self Care | Payer: Self-pay | Attending: Emergency Medicine | Admitting: Emergency Medicine

## 2011-09-16 ENCOUNTER — Emergency Department (HOSPITAL_COMMUNITY): Payer: Self-pay

## 2011-09-16 ENCOUNTER — Encounter (HOSPITAL_COMMUNITY): Payer: Self-pay | Admitting: Physical Medicine and Rehabilitation

## 2011-09-16 DIAGNOSIS — N644 Mastodynia: Secondary | ICD-10-CM | POA: Insufficient documentation

## 2011-09-16 DIAGNOSIS — D72829 Elevated white blood cell count, unspecified: Secondary | ICD-10-CM | POA: Insufficient documentation

## 2011-09-16 DIAGNOSIS — E876 Hypokalemia: Secondary | ICD-10-CM | POA: Insufficient documentation

## 2011-09-16 DIAGNOSIS — R1011 Right upper quadrant pain: Secondary | ICD-10-CM | POA: Insufficient documentation

## 2011-09-16 LAB — DIFFERENTIAL
Basophils Absolute: 0 10*3/uL (ref 0.0–0.1)
Eosinophils Absolute: 0.2 10*3/uL (ref 0.0–0.7)
Lymphocytes Relative: 18 % (ref 12–46)
Lymphs Abs: 3.5 10*3/uL (ref 0.7–4.0)
Monocytes Absolute: 2.5 10*3/uL — ABNORMAL HIGH (ref 0.1–1.0)
Neutro Abs: 13.3 10*3/uL — ABNORMAL HIGH (ref 1.7–7.7)

## 2011-09-16 LAB — D-DIMER, QUANTITATIVE: D-Dimer, Quant: 0.93 ug/mL-FEU — ABNORMAL HIGH (ref 0.00–0.48)

## 2011-09-16 LAB — URINALYSIS, ROUTINE W REFLEX MICROSCOPIC
Glucose, UA: NEGATIVE mg/dL
Hgb urine dipstick: NEGATIVE
Specific Gravity, Urine: 1.014 (ref 1.005–1.030)
Urobilinogen, UA: 1 mg/dL (ref 0.0–1.0)

## 2011-09-16 LAB — CBC
HCT: 37.5 % (ref 36.0–46.0)
MCHC: 34.9 g/dL (ref 30.0–36.0)
RDW: 12.6 % (ref 11.5–15.5)

## 2011-09-16 LAB — MAGNESIUM: Magnesium: 1.7 mg/dL (ref 1.5–2.5)

## 2011-09-16 LAB — COMPREHENSIVE METABOLIC PANEL
Albumin: 3.6 g/dL (ref 3.5–5.2)
BUN: 6 mg/dL (ref 6–23)
Calcium: 9.6 mg/dL (ref 8.4–10.5)
Creatinine, Ser: 0.67 mg/dL (ref 0.50–1.10)
Potassium: 2.7 mEq/L — CL (ref 3.5–5.1)
Total Protein: 7.9 g/dL (ref 6.0–8.3)

## 2011-09-16 LAB — URINE MICROSCOPIC-ADD ON

## 2011-09-16 LAB — POCT PREGNANCY, URINE: Preg Test, Ur: NEGATIVE

## 2011-09-16 LAB — LIPASE, BLOOD: Lipase: 12 U/L (ref 11–59)

## 2011-09-16 MED ORDER — PENICILLIN V POTASSIUM 500 MG PO TABS: 500.0000 mg | ORAL_TABLET | Freq: Three times a day (TID) | ORAL | Status: DC

## 2011-09-16 MED ORDER — SODIUM CHLORIDE 0.9 % IV SOLN
Freq: Once | INTRAVENOUS | Status: AC
  Administered 2011-09-16: 16:00:00 via INTRAVENOUS

## 2011-09-16 MED ORDER — POTASSIUM CHLORIDE ER 10 MEQ PO TBCR: 20.0000 meq | EXTENDED_RELEASE_TABLET | Freq: Two times a day (BID) | ORAL | Status: DC

## 2011-09-16 MED ORDER — TRAMADOL HCL 50 MG PO TABS: 50.0000 mg | ORAL_TABLET | Freq: Four times a day (QID) | ORAL | Status: AC | PRN

## 2011-09-16 MED ORDER — ONDANSETRON HCL 4 MG PO TABS: 4.0000 mg | ORAL_TABLET | Freq: Four times a day (QID) | ORAL | Status: AC

## 2011-09-16 MED ORDER — POTASSIUM CHLORIDE CRYS ER 20 MEQ PO TBCR
40.0000 meq | EXTENDED_RELEASE_TABLET | Freq: Once | ORAL | Status: AC
  Administered 2011-09-16: 40 meq via ORAL
  Filled 2011-09-16: qty 2

## 2011-09-16 MED ORDER — HYDROMORPHONE HCL PF 1 MG/ML IJ SOLN
1.0000 mg | Freq: Once | INTRAMUSCULAR | Status: AC
  Administered 2011-09-16: 1 mg via INTRAVENOUS
  Filled 2011-09-16: qty 1

## 2011-09-16 MED ORDER — IOHEXOL 300 MG/ML  SOLN
200.0000 mL | Freq: Once | INTRAMUSCULAR | Status: AC | PRN
  Administered 2011-09-16: 200 mL via INTRAVENOUS

## 2011-09-16 MED ORDER — SODIUM CHLORIDE 0.9 % IV BOLUS (SEPSIS)
1000.0000 mL | Freq: Once | INTRAVENOUS | Status: AC
  Administered 2011-09-16: 1000 mL via INTRAVENOUS

## 2011-09-16 MED ORDER — POTASSIUM CHLORIDE 10 MEQ/100ML IV SOLN
10.0000 meq | Freq: Once | INTRAVENOUS | Status: AC
  Administered 2011-09-16: 10 meq via INTRAVENOUS
  Filled 2011-09-16: qty 100

## 2011-09-16 MED ORDER — ONDANSETRON HCL 4 MG/2ML IJ SOLN
4.0000 mg | Freq: Once | INTRAMUSCULAR | Status: AC
  Administered 2011-09-16: 4 mg via INTRAVENOUS
  Filled 2011-09-16: qty 2

## 2011-09-16 NOTE — ED Provider Notes (Signed)
Assumed pt care from Tara Fontana, PA-C.  21 year old female presents with RUQ abd pain and a negative abd/pelvic CT 3 days ago.  Her labs are remarkable for a K+ of 2.7.  Mildly elevated D-dimer of 0.93, and elevated WBC of 19.5.  Due to elevated D-dimer, will obtain chest CTA.  Will replenish K+.  Will check mag level and obtain EKG.     6:15 PM Chest CTA shows no acute abnormality.  Pt sts her pain has improved.  On evaluation, abd with mild tenderness to RUQ, non surgical, no rash.  Lung CTAB.  Pt has had mult. Scans for the past 2 days without acute findings.  She has had persistent elevated WBC x 7 months.  No night sweats, weight changes, or myalgias indicative of cancer.  No steroid use.  I discussed with my attending.  Plan to consult hematology for further evaluation.  I have placed a call to Dr. Welton Flakes from [facility].  Currently awaits call back.    6:54 PM I have spoken with Dr. Welton Flakes, who agrees to see pt outpatient for further evaluation.  She also recommend faxing information to Casper Harrison (care coordinator and Chesley Noon) fax: [phone], [phone].  We have faxed the appropriate information.  I have discussed with pt.  Pt agrees to f/u.   Will d/c with pain medication, antinausea, and resources referral.  Pt stable to be d/c.  Able to tolerates PO.    Results for orders placed during the hospital encounter of 09/16/11  URINALYSIS, ROUTINE W REFLEX MICROSCOPIC      Component Value Range   Color, Urine YELLOW  YELLOW    APPearance CLEAR  CLEAR    Specific Gravity, Urine 1.014  1.005 - 1.030    pH 6.0  5.0 - 8.0    Glucose, UA NEGATIVE  NEGATIVE (mg/dL)   Hgb urine dipstick NEGATIVE  NEGATIVE    Bilirubin Urine NEGATIVE  NEGATIVE    Ketones, ur NEGATIVE  NEGATIVE (mg/dL)   Protein, ur NEGATIVE  NEGATIVE (mg/dL)   Urobilinogen, UA 1.0  0.0 - 1.0 (mg/dL)   Nitrite NEGATIVE  NEGATIVE    Leukocytes, UA SMALL (*) NEGATIVE   POCT PREGNANCY, URINE      Component Value Range     Preg Test, Ur NEGATIVE  NEGATIVE   URINE MICROSCOPIC-ADD ON      Component Value Range   Squamous Epithelial / LPF RARE  RARE    WBC, UA 7-10  <3 (WBC/hpf)   RBC / HPF 0-2  <3 (RBC/hpf)   Bacteria, UA RARE  RARE   CBC      Component Value Range   WBC 19.5 (*) 4.0 - 10.5 (K/uL)   RBC 4.33  3.87 - 5.11 (MIL/uL)   Hemoglobin 13.1  12.0 - 15.0 (g/dL)   HCT 30.8  65.7 - 84.6 (%)   MCV 86.6  78.0 - 100.0 (fL)   MCH 30.3  26.0 - 34.0 (pg)   MCHC 34.9  30.0 - 36.0 (g/dL)   RDW 96.2  95.2 - 84.1 (%)   Platelets 314  150 - 400 (K/uL)  DIFFERENTIAL      Component Value Range   Neutrophils Relative 68  43 - 77 (%)   Lymphocytes Relative 18  12 - 46 (%)   Monocytes Relative 13 (*) 3 - 12 (%)   Eosinophils Relative 1  0 - 5 (%)   Basophils Relative 0  0 - 1 (%)  Neutro Abs 13.3 (*) 1.7 - 7.7 (K/uL)   Lymphs Abs 3.5  0.7 - 4.0 (K/uL)   Monocytes Absolute 2.5 (*) 0.1 - 1.0 (K/uL)   Eosinophils Absolute 0.2  0.0 - 0.7 (K/uL)   Basophils Absolute 0.0  0.0 - 0.1 (K/uL)   WBC Morphology ATYPICAL LYMPHOCYTES    COMPREHENSIVE METABOLIC PANEL      Component Value Range   Sodium 139  135 - 145 (mEq/L)   Potassium 2.7 (*) 3.5 - 5.1 (mEq/L)   Chloride 101  96 - 112 (mEq/L)   CO2 25  19 - 32 (mEq/L)   Glucose, Bld 99  70 - 99 (mg/dL)   BUN 6  6 - 23 (mg/dL)   Creatinine, Ser 4.03  0.50 - 1.10 (mg/dL)   Calcium 9.6  8.4 - 47.4 (mg/dL)   Total Protein 7.9  6.0 - 8.3 (g/dL)   Albumin 3.6  3.5 - 5.2 (g/dL)   AST 14  0 - 37 (U/L)   ALT 12  0 - 35 (U/L)   Alkaline Phosphatase 59  39 - 117 (U/L)   Total Bilirubin 0.2 (*) 0.3 - 1.2 (mg/dL)   GFR calc non Af Amer >90  >90 (mL/min)   GFR calc Af Amer >90  >90 (mL/min)  LIPASE, BLOOD      Component Value Range   Lipase 12  11 - 59 (U/L)  D-DIMER, QUANTITATIVE      Component Value Range   D-Dimer, Quant 0.93 (*) 0.00 - 0.48 (ug/mL-FEU)  MAGNESIUM      Component Value Range   Magnesium 1.7  1.5 - 2.5 (mg/dL)   Dg Chest 2 View  2/59/5638   *RADIOLOGY REPORT*  Clinical Data: Right-sided lower chest pain for 1 week  CHEST - 2 VIEW  Comparison: CT abdomen pelvis 09/13/2011.  Findings: Slight elevation right hemidiaphragm does not appear to be accompanied by significant atelectasis or infiltrate.  There is no effusion or pneumothorax.  Low lung volumes accentuate cardiac size.  Left lung is clear.  There is no pneumothorax.  No osseous findings.  IMPRESSION: Slight elevation right hemidiaphragm without definite underlying cardiopulmonary disease.  Poor inspiratory effort.  Original Report Authenticated By: Elsie Stain, M.D.   Ct Angio Chest W/cm &/or Wo Cm  09/16/2011  *RADIOLOGY REPORT*  Clinical Data: Pain under left breast.  Question acute pulmonary embolism.  CT ANGIOGRAPHY CHEST  Technique:  Multidetector CT imaging of the chest using the standard protocol during bolus administration of intravenous contrast. Multiplanar reconstructed images including MIPs were obtained and reviewed to evaluate the vascular anatomy.  Contrast: OMNIPAQUE IOHEXOL 300 MG/ML  SOLN total contrast volume; study was repeated due to limited opacification of the pulmonary arteries on the initial scan.  Comparison: Chest radiographs 09/16/2011.  Findings: Despite repeating the examination, opacification of the pulmonary arteries is suboptimal. There is no evidence of acute pulmonary embolism.  The thoracic aorta and great vessels appear normal.  There is residual thymic tissue within the anterior mediastinum.  No enlarged mediastinal or hilar lymph nodes are present.  There is no pleural or pericardial effusion. No chest wall abnormalities are seen.  2 mm right upper lobe nodule (image 31 of series 4) is likely a small granuloma.  The lungs are otherwise clear.  The visualized upper abdomen appears unremarkable.  IMPRESSION: No evidence of acute pulmonary embolism or other acute chest process.  Original Report Authenticated By: Gerrianne Scale, M.D.   US Abdomen  Complete  09/16/2011  *RADIOLOGY REPORT*  Clinical Data:  Abdominal pain.  ABDOMINAL ULTRASOUND COMPLETE  Comparison:  None.  Findings:  Gallbladder:  No gallstones, gallbladder wall thickening, or pericholecystic fluid.  Common Bile Duct:  Within normal limits in caliber.  Liver: No focal mass lesion identified.  Within normal limits in parenchymal echogenicity.  IVC:  Appears normal.  Pancreas:  No abnormality identified.  Spleen:  Within normal limits in size and echotexture.  Right kidney:  Normal in size and parenchymal echogenicity.  No evidence of mass or hydronephrosis.  Left kidney:  Normal in size and parenchymal echogenicity.  No evidence of mass or hydronephrosis.  Abdominal Aorta:  No aneurysm identified.  IMPRESSION: Negative abdominal ultrasound.  Original Report Authenticated By: Elsie Stain, M.D.   Ct Abdomen Pelvis W Contrast  09/13/2011  *RADIOLOGY REPORT*  Clinical Data: Three day onset of gradually worsening upper abdominal pain with constipation.  CT ABDOMEN AND PELVIS WITH CONTRAST  Technique:  Multidetector CT imaging of the abdomen and pelvis was performed following the standard protocol during bolus administration of intravenous contrast.  Contrast: 75mL OMNIPAQUE IOHEXOL 300 MG/ML  SOLN  Comparison: None.  Findings: The lung bases are clear.  There is no pleural effusion. The liver, gallbladder, biliary system and pancreas appear normal. The spleen, kidneys and adrenal glands appear normal.  There is no hydronephrosis or perinephric soft tissue stranding.  The bowel gas pattern is normal.  The appendix appears normal.  No inflammatory changes are identified.  The uterus appears normal. There are no suspicious adnexal findings.  There is a small left pelvic calcification on image 73.  The relationship to the distal left ureter is difficult to define, although this is likely a phlebolith.  No osseous abnormalities are seen.  IMPRESSION:  1.  No acute abdominal pelvic findings  identified. 2.  Small left pelvic calcification appears to be external to the left ureter and is probably a phlebolith.  No hydronephrosis.  Original Report Authenticated By: Gerrianne Scale, M.D.      Fayrene Helper, PA-C 09/16/11 1856

## 2011-09-16 NOTE — Discharge Instructions (Signed)
Please follow up with Dr. Welton Flakes, a blood doctor, for further evaluation of your elevated white blood count.  Return if your symptoms worsen.    Abdominal Pain (Nonspecific) Your exam might not show the exact reason you have abdominal pain. Since there are many different causes of abdominal pain, another checkup and more tests may be needed. It is very important to follow up for lasting (persistent) or worsening symptoms. A possible cause of abdominal pain in any person who still has his or her appendix is acute appendicitis. Appendicitis is often hard to diagnose. Normal blood tests, urine tests, ultrasound, and CT scans do not completely rule out early appendicitis or other causes of abdominal pain. Sometimes, only the changes that happen over time will allow appendicitis and other causes of abdominal pain to be determined. Other potential problems that may require surgery may also take time to become more apparent. Because of this, it is important that you follow all of the instructions below. HOME CARE INSTRUCTIONS   Rest as much as possible.   Do not eat solid food until your pain is gone.   While adults or children have pain: A diet of water, weak decaffeinated tea, broth or bouillon, gelatin, oral rehydration solutions (ORS), frozen ice pops, or ice chips may be helpful.   When pain is gone in adults or children: Start a light diet (dry toast, crackers, applesauce, or white rice). Increase the diet slowly as long as it does not bother you. Eat no dairy products (including cheese and eggs) and no spicy, fatty, fried, or high-fiber foods.   Use no alcohol, caffeine, or cigarettes.   Take your regular medicines unless your caregiver told you not to.   Take any prescribed medicine as directed.   Only take over-the-counter or prescription medicines for pain, discomfort, or fever as directed by your caregiver. Do not give aspirin to children.  If your caregiver has given you a follow-up  appointment, it is very important to keep that appointment. Not keeping the appointment could result in a permanent injury and/or lasting (chronic) pain and/or disability. If there is any problem keeping the appointment, you must call to reschedule.  SEEK IMMEDIATE MEDICAL CARE IF:   Your pain is not gone in 24 hours.   Your pain becomes worse, changes location, or feels different.   You or your child has an oral temperature above 102 F (38.9 C), not controlled by medicine.   Your baby is older than 3 months with a rectal temperature of 102 F (38.9 C) or higher.   Your baby is 47 months old or younger with a rectal temperature of 100.4 F (38 C) or higher.   You have shaking chills.   You keep throwing up (vomiting) or cannot drink liquids.   There is blood in your vomit or you see blood in your bowel movements.   Your bowel movements become dark or black.   You have frequent bowel movements.   Your bowel movements stop (become blocked) or you cannot pass gas.   You have bloody, frequent, or painful urination.   You have yellow discoloration in the skin or whites of the eyes.   Your stomach becomes bloated or bigger.   You have dizziness or fainting.   You have chest or back pain.  MAKE SURE YOU:   Understand these instructions.   Will watch your condition.   Will get help right away if you are not doing well or get worse.  Document  Released: 04/19/2005 Document Revised: 04/08/2011 Document Reviewed: 03/17/2009 [facility] Patient Information 2012 [place], [place].  Hypokalemia Hypokalemia means a low potassium level in the blood. Symptoms may include muscle weakness and cramping, fatigue, abdominal pain, vomiting, constipation, or irregularities of the heartbeat. Sometimes hypokalemia is discovered by your caregiver if you are taking certain medicines for high blood pressure or kidney disease.  Potassium is an electrolyte that helps regulate the amount of fluid in the  body. It also stimulates muscle contraction and maintains a stable acid-base balance. If potassium levels go too low or too high, your health may be in danger. You are at risk for developing shock, heart, and lung problems. Hypokalemia can occur if you have excessive diarrhea, vomiting, or sweating. Potassium can be lost through your kidneys in the urine. Certain common medicines can also cause potassium loss, especially water pills (diuretics). The same is possible with cortisone medications or certain types of antibiotics. Low potassium can be dangerous if you are taking certain heart medicines. In diabetes, your potassium may fall after you take insulin, especially if your diabetes had been out of control for a while. In rare cases, potassium may be low because you are not getting enough in your diet.  In adults, a potassium level below 3.5 mEq/L is usually considered low. Hypokalemia can be treated with potassium supplements taken by mouth and a diet that is high in potassium. Foods with high potassium content are:  Peas, lentils, lima beans, nuts, and dried fruit.   Whole grain and bran cereals and breads.   Fresh fruit and vegetables. Examples include:   Bananas.   Cantaloupe.   Grapefruit.   Oranges.   Tomatoes.   Honeydew melons.   Potatoes.   Peaches.   Orange and tomato juices.   Meats.  See your caregiver as instructed for a follow-up blood test to be sure your potassium is back to normal. SEEK MEDICAL CARE IF:   You have nausea, vomiting, constipation, or abdominal pain.   You have palpitations or irregular heartbeats, chest pain or shortness of breath.   You have muscle cramps or weakness or fatigue.   You have lethargy.  SEEK IMMEDIATE MEDICAL CARE IF:   You have paralysis.   You have confusion or other mental status changes.  Document Released: 04/19/2005 Document Revised: 04/08/2011 Document Reviewed: 08/13/2009 [facility] Patient Information 2012  [place], [place].Hypokalemia Hypokalemia means a low potassium level in the blood. Symptoms may include muscle weakness and cramping, fatigue, abdominal pain, vomiting, constipation, or irregularities of the heartbeat. Sometimes hypokalemia is discovered by your caregiver if you are taking certain medicines for high blood pressure or kidney disease.  Potassium is an electrolyte that helps regulate the amount of fluid in the body. It also stimulates muscle contraction and maintains a stable acid-base balance. If potassium levels go too low or too high, your health may be in danger. You are at risk for developing shock, heart, and lung problems. Hypokalemia can occur if you have excessive diarrhea, vomiting, or sweating. Potassium can be lost through your kidneys in the urine. Certain common medicines can also cause potassium loss, especially water pills (diuretics). The same is possible with cortisone medications or certain types of antibiotics. Low potassium can be dangerous if you are taking certain heart medicines. In diabetes, your potassium may fall after you take insulin, especially if your diabetes had been out of control for a while. In rare cases, potassium may be low because you are not getting enough in your  diet.  In adults, a potassium level below 3.5 mEq/L is usually considered low. Hypokalemia can be treated with potassium supplements taken by mouth and a diet that is high in potassium. Foods with high potassium content are:  Peas, lentils, lima beans, nuts, and dried fruit.   Whole grain and bran cereals and breads.   Fresh fruit and vegetables. Examples include:   Bananas.   Cantaloupe.   Grapefruit.   Oranges.   Tomatoes.   Honeydew melons.   Potatoes.   Peaches.   Orange and tomato juices.   Meats.  See your caregiver as instructed for a follow-up blood test to be sure your potassium is back to normal. SEEK MEDICAL CARE IF:   You have nausea, vomiting, constipation,  or abdominal pain.   You have palpitations or irregular heartbeats, chest pain or shortness of breath.   You have muscle cramps or weakness or fatigue.   You have lethargy.  SEEK IMMEDIATE MEDICAL CARE IF:   You have paralysis.   You have confusion or other mental status changes.  Document Released: 04/19/2005 Document Revised: 04/08/2011 Document Reviewed: 08/13/2009 [facility] Patient Information 2012 [place], [place].

## 2011-09-16 NOTE — ED Provider Notes (Signed)
History   This chart was scribed for Glynn Octave, MD by Charolett Bumpers . The patient was seen in room STRE3/STRE3.    CSN: [id number]  Arrival date & time 09/16/11  1236   First MD Initiated Contact with Patient 09/16/11 1352      Chief Complaint  Patient presents with  . Abdominal Pain    (Consider location/radiation/quality/duration/timing/severity/associated sxs/prior treatment) HPI Tara Parrish is a 21 y.o. female who presents to the Emergency Department complaining of constant, moderate epigastric abdominal pain. Patient states that her abdominal pain is a 10/10. Patient states that "I feel like I swallowed a ball and it's stuck." Patient states she was seen here 3 days with a lower abdomen pain. Patient states that she was discharged home with prescriptions but was unable to fill them. Patient reports a fever of 100 yesterday. Patient denies any urinary symptoms. Patient denies any recent surgeries or injuries. Patient states that her LNMP was last week and denies taking any birth control.   Past Medical History  Diagnosis Date  . Pregnancy induced hypertension   . Chlamydia 11/2010    treated    Past Surgical History  Procedure Date  . No past surgeries     No family history on file.  History  Substance Use Topics  . Smoking status: Former Smoker    Quit date: 09/06/2010  . Smokeless tobacco: Never Used  . Alcohol Use: No    OB History    Grav Para Term Preterm Abortions TAB SAB Ect Mult Living   2 2 2  0 0 0 0 0 0 2      Review of Systems  Constitutional: Positive for fever.  Respiratory: Negative for shortness of breath.   Cardiovascular: Negative for chest pain.  Gastrointestinal: Positive for abdominal pain. Negative for vomiting.  Genitourinary: Negative for dysuria and hematuria.  Musculoskeletal: Negative for back pain.  All other systems reviewed and are negative.    Allergies  Review of patient's allergies indicates no known  allergies.  Home Medications   Current Outpatient Rx  Name Route Sig Dispense Refill  . IBUPROFEN 200 MG PO TABS Oral Take 400-800 mg by mouth every 6 (six) hours as needed. For pain    . TRAMADOL HCL 50 MG PO TABS Oral Take 1 tablet (50 mg total) by mouth every 8 (eight) hours as needed for pain. 15 tablet 0    BP 150/89  Pulse 90  Temp(Src) 98.4 F (36.9 C) (Oral)  Resp 18  SpO2 99%  LMP 09/06/2011  Physical Exam  Nursing note and vitals reviewed. Constitutional: She is oriented to person, place, and time. She appears well-developed and well-nourished. No distress.  HENT:  Head: Normocephalic and atraumatic.  Eyes: EOM are normal.  Neck: Neck supple. No tracheal deviation present.  Cardiovascular: Normal rate.   Pulmonary/Chest: Effort normal. No respiratory distress.  Abdominal: There is tenderness. There is guarding.       RUQ tenderness to palpation. No CVA tenderness. Lower abdomen soft and non-tender.   Musculoskeletal: Normal range of motion.  Neurological: She is alert and oriented to person, place, and time.  Skin: Skin is warm and dry.  Psychiatric: She has a normal mood and affect. Her behavior is normal.    ED Course  Procedures (including critical care time)  DIAGNOSTIC STUDIES: Oxygen Saturation is 99% on room air, normal by my interpretation.    COORDINATION OF CARE:  1358: Discussed planned course of treatment with the patient,  who is agreeable at this time.     Labs Reviewed  URINALYSIS, ROUTINE W REFLEX MICROSCOPIC - Abnormal; Notable for the following:    Leukocytes, UA SMALL (*)    All other components within normal limits  POCT PREGNANCY, URINE  URINE MICROSCOPIC-ADD ON   No results found.   No diagnosis found.    MDM  UPper abdominal pain x 3 days without vomiting.  Seen 3 days ago, had negative CT, told "gas" did not fill prescriptions. Abdomen soft with TTP in RUQ, better with laying down.  CDU for US abdomen to evaluate  gallbladder. 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   I personally performed the services described in this documentation, which was scribed in my presence.  The recorded information has been reviewed and considered.      Glynn Octave, MD 09/16/11 2129

## 2011-09-16 NOTE — ED Notes (Signed)
INFO FAXED BY [facility] TO [facility] CARE COORDINATOR

## 2011-09-16 NOTE — ED Provider Notes (Signed)
Medical screening examination/treatment/procedure(s) were conducted as a shared visit with non-physician practitioner(s) and myself.  I personally evaluated the patient during the encounter   Glynn Octave, MD 09/16/11 2127

## 2011-09-16 NOTE — ED Notes (Signed)
LAB HAS CALLED A CRITICAL K. 2.7 PA AWARE

## 2011-09-16 NOTE — ED Notes (Signed)
Pt states she was seen here on Monday for same thing.  Pt was told that she had gas.  Pt reports that pain got better on Tuesday but got worse yesterday.  Pt states reports having RUQ pain that is worse with movement and eating.  Pt reports that pain feels like she has a "ball" up under RUQ.  Pt states that she has passed some gas and has had a couple of small bowel movements but not normal.  Pt not tender upon palpation.  Pt reports nausea but no vomiting.  Pt visibly uncomfortable but not in distress.

## 2011-09-16 NOTE — ED Notes (Addendum)
Pt presents to department for evaluation of epigastric pain. Was seen for same on Monday and discharged home with prescriptions. Pt states she is unable to afford to get them filled. States pain and discomfort has continued while at home. 10/10 pain upon arrival. Pt states "I feel like I swallowed a ball and it's stuck." she is alert and oriented x4. No nausea/vomiting. Denies urinary symptoms. Pt states fever of 100.0 yesterday.

## 2011-09-16 NOTE — ED Notes (Signed)
Family at bedside. 

## 2011-09-16 NOTE — ED Notes (Signed)
MD at bedside. 

## 2011-09-21 ENCOUNTER — Telehealth: Payer: Self-pay | Admitting: Hematology and Oncology

## 2011-09-21 NOTE — Telephone Encounter (Signed)
S/w pt re appt for 5/29 @ 10 am w/LO.

## 2011-09-22 ENCOUNTER — Telehealth: Payer: Self-pay | Admitting: Hematology and Oncology

## 2011-09-22 NOTE — Telephone Encounter (Signed)
Referred by Dr. Glynn Octave Dx-Leukocytosis

## 2011-09-29 ENCOUNTER — Inpatient Hospital Stay (HOSPITAL_COMMUNITY)
Admission: AD | Admit: 2011-09-29 | Discharge: 2011-09-29 | Disposition: A | Discharge status: Home / Self Care | Payer: Self-pay | Source: Ambulatory Visit | Attending: Obstetrics and Gynecology | Admitting: Obstetrics and Gynecology

## 2011-09-29 ENCOUNTER — Encounter: Payer: Self-pay | Admitting: Hematology and Oncology

## 2011-09-29 ENCOUNTER — Ambulatory Visit (HOSPITAL_BASED_OUTPATIENT_CLINIC_OR_DEPARTMENT_OTHER): Payer: Medicaid Other | Admitting: Hematology and Oncology

## 2011-09-29 ENCOUNTER — Encounter (HOSPITAL_COMMUNITY): Payer: Self-pay | Admitting: *Deleted

## 2011-09-29 ENCOUNTER — Ambulatory Visit: Payer: Medicaid Other

## 2011-09-29 ENCOUNTER — Ambulatory Visit: Payer: Medicaid Other | Admitting: Lab

## 2011-09-29 VITALS — BP 173/103 | HR 116 | Temp 101.9°F | Ht 66.0 in | Wt 188.7 lb

## 2011-09-29 DIAGNOSIS — Z87898 Personal history of other specified conditions: Secondary | ICD-10-CM

## 2011-09-29 DIAGNOSIS — R109 Unspecified abdominal pain: Secondary | ICD-10-CM | POA: Insufficient documentation

## 2011-09-29 DIAGNOSIS — D72829 Elevated white blood cell count, unspecified: Secondary | ICD-10-CM

## 2011-09-29 DIAGNOSIS — N739 Female pelvic inflammatory disease, unspecified: Secondary | ICD-10-CM

## 2011-09-29 DIAGNOSIS — N730 Acute parametritis and pelvic cellulitis: Secondary | ICD-10-CM | POA: Insufficient documentation

## 2011-09-29 LAB — CBC WITH DIFFERENTIAL/PLATELET
BASO%: 0.5 % (ref 0.0–2.0)
EOS%: 0.3 % (ref 0.0–7.0)
HCT: 32.9 % — ABNORMAL LOW (ref 34.8–46.6)
LYMPH%: 10.7 % — ABNORMAL LOW (ref 14.0–49.7)
MCH: 29.3 pg (ref 25.1–34.0)
MCHC: 33.2 g/dL (ref 31.5–36.0)
NEUT%: 82.3 % — ABNORMAL HIGH (ref 38.4–76.8)
RBC: 3.74 10*6/uL (ref 3.70–5.45)
WBC: 15 10*3/uL — ABNORMAL HIGH (ref 3.9–10.3)
lymph#: 1.6 10*3/uL (ref 0.9–3.3)
nRBC: 0 % (ref 0–0)

## 2011-09-29 LAB — URINALYSIS, ROUTINE W REFLEX MICROSCOPIC
Glucose, UA: NEGATIVE mg/dL
Nitrite: NEGATIVE
Specific Gravity, Urine: >1.03 — ABNORMAL HIGH (ref 1.005–1.030)
pH: 6 (ref 5.0–8.0)

## 2011-09-29 LAB — MORPHOLOGY: PLT EST: INCREASED

## 2011-09-29 LAB — URINE MICROSCOPIC-ADD ON

## 2011-09-29 MED ORDER — METRONIDAZOLE 500 MG PO TABS: 500.0000 mg | ORAL_TABLET | Freq: Two times a day (BID) | ORAL | Status: AC

## 2011-09-29 MED ORDER — CEFTRIAXONE SODIUM 250 MG IJ SOLR
250.0000 mg | Freq: Once | INTRAMUSCULAR | Status: AC
  Administered 2011-09-29: 250 mg via INTRAMUSCULAR
  Filled 2011-09-29: qty 250

## 2011-09-29 MED ORDER — KETOROLAC TROMETHAMINE 60 MG/2ML IM SOLN
60.0000 mg | Freq: Once | INTRAMUSCULAR | Status: AC
  Administered 2011-09-29: 60 mg via INTRAMUSCULAR
  Filled 2011-09-29: qty 2

## 2011-09-29 MED ORDER — DOXYCYCLINE HYCLATE 50 MG PO CAPS: 100.0000 mg | ORAL_CAPSULE | Freq: Two times a day (BID) | ORAL | Status: AC

## 2011-09-29 MED ORDER — FLUCONAZOLE 150 MG PO TABS: 150.0000 mg | ORAL_TABLET | Freq: Once | ORAL | Status: AC

## 2011-09-29 MED ORDER — ACETAMINOPHEN 325 MG PO TABS
650.0000 mg | ORAL_TABLET | Freq: Once | ORAL | Status: AC
  Administered 2011-09-29: 650 mg via ORAL
  Filled 2011-09-29: qty 2

## 2011-09-29 NOTE — MAU Note (Signed)
Sent here from [place] long after doctors appt for Pap smear to check for pelvic infection

## 2011-09-29 NOTE — MAU Note (Signed)
Pt reports abd pain x 1.5 weeks, seen at [facility] ER x 2 and was told it was "gas pain" seen at [facility] today and was told it was a pelvic infection and told her to come here. Currently has her period (09/26/2011). Denies nausea, vomiting, diarrhea. Fever 101.9 this am.

## 2011-09-29 NOTE — Patient Instructions (Signed)
Tara Parrish  [id number]  [facility] Discharge Instructions  RECOMMENDATIONS MADE BY THE CONSULTANT AND ANY TEST RESULTS WILL BE SENT TO YOUR REFERRING DOCTOR.   EXAM FINDINGS BY MD TODAY AND SIGNS AND SYMPTOMS TO REPORT TO CLINIC OR PRIMARY MD:   Your current list of medications are: Current Outpatient Prescriptions  Medication Sig Dispense Refill  . traMADol (ULTRAM) 50 MG tablet Take 50 mg by mouth every 6 (six) hours as needed. Take 1 tablet every  6 - 8 hrs prn.      . ibuprofen (ADVIL,MOTRIN) 200 MG tablet Take 400-800 mg by mouth every 6 (six) hours as needed. For pain      . potassium chloride (K-DUR) 10 MEQ tablet Take 2 tablets (20 mEq total) by mouth 2 (two) times daily.  10 tablet  0     INSTRUCTIONS GIVEN AND DISCUSSED:   SPECIAL INSTRUCTIONS/FOLLOW-UP:  See above.  I acknowledge that I have been informed and understand all the instructions given to me and received a copy. I do not have any more questions at this time, but understand that I may call the [facility] at [phone] during business hours should I have any further questions or need assistance in obtaining follow-up care.

## 2011-09-29 NOTE — Progress Notes (Signed)
Patient came in today as a new patient she has no insurance,her medicaid is not active but I did put a call in to her case worker Ms.Yvette Neal.I am waiting for her to return my call. I did give the patient an epp application to fill out and return to me.

## 2011-09-29 NOTE — Progress Notes (Signed)
CC:   Tara Fontana, PA-C [facility]  IDENTIFYING STATEMENT:  The patient is a 21 year old woman seen at the request of Tara Parrish, [place] in emergency room with leukocytosis.  INTERVAL HISTORY:  The patient does not have a primary care physician, but reports two ER visits in the last week for persistent lower abdominal discomfort with vaginal discharge.  She has past medical history significant for chlamydia treated during her pregnancy with her 2nd child a year ago.  In the emergency room  she received a CT scan of the chest, abdomen, and pelvis that was essentially unremarkable.  She was found to have an elevated white cell count.  Pregnancy test was negative x2.  CBC in the system reports on 09/15/2011 white cell count 20.3, hemoglobin 13, hematocrit 27.4, platelets 260.  On 01/22/2011 white cell count 13.6, hemoglobin 11.9, hematocrit 34.7, platelets 298. She is moving her bowels without difficulty.  She denies history of menorrhagia.  She does have history positive for fever, chills and what she reports as night sweats.  She has not lost any weight.  Denies adenopathy.  MEDICATIONS:  Tramadol 50 mg q.6h p.r.n. for pain, K-Dur 20 mEq daily, Motrin 200 mg q.6 hours p.r.n. pain.  ALLERGIES:  None.  PAST MEDICAL HISTORY:  History for chlamydia, otherwise negative.  SOCIAL HISTORY:  Former smoker, quit 6 months ago, smoking half a cigarette daily for 5 years.  Denies alcohol use.  She is single and has 2 children ages 48 months and 3 years.  She is currently unemployed.  FAMILY HISTORY:  Negative for oncologic or hematologic disorders.  REVIEW OF SYSTEMS:  GI:  Denies nausea, vomiting.  Admits to low abnormal/pelvic discomfort which currently rates as 8/10.  Pain is constant.  No relieving or exacerbating factors.  She is prone to constipation, but moves bowels.  Denies melena, rectal bleeding.  GU: Denies dysuria, hematuria, nocturia, frequency.   Cardiovascular:  Denies chest pain, PND, orthopnea, ankle swelling.  Respiratory:  She denies cough, hemoptysis, wheeze, shortness of breath.  Skin:  No bruising or bleeding.  Lymph nodes:  No adenopathy.  CNS:  Nonfocal.  PHYSICAL EXAM:  The patient is alert and oriented x3.  Vitals:  Pulse 116, blood pressure 173/103, temperature 101.9, respirations 18, weight 188 pounds.  HEENT:  Head is atraumatic, normocephalic.  Mouth moist.  Neck:  Supple.  Chest:  Clear.  Cardiovascular:  First and second heart sounds present.  Abdomen:  Obese.  Soft.  Tender on deep palpation in the pelvic area without rebound or guarding.  Extremities: No calf tenderness.  Pulses are symmetrical.  CNS:  Nonfocal.  Skin:  No bruising.  Lymph nodes:  No adenopathy.  LAB DATA:  CBC obtained at the Cancer Center 09/29/2011:  White cell count 15, hemoglobin 10.9, hematocrit 2.9, platelets 470.  Peripheral smear essentially unremarkable.  IMPRESSION AND PLAN:  Tara Parrish is a 21 year old woman with leukocytosis, pelvic discomfort and history of sexually transmitted disease infection during pregnancy.  The differential diagnosis for leukocytosis is underlying infection.  I feel that at this point she needs a gynecologic evaluation and treatment.  I have spoken with Dr. Debroah Loop at [facility] and he has agreed very kindly to see and evaluate the patient.  She would probably benefit from a course of antibiotics.  In the interim, I have gone ahead and obtained ESR and leukocyte alkaline phosphate testing.  I will wait for her to get treated.  If her white cell count  continues to remain elevated, she needs to be referred back and will work her up for myeloproliferative disorder.  The patient is here with her sister who voices her understanding of the recommendations, and has agreed to follow up at [facility] emergency room as soon as she leaves here today.    ______________________________ Tara Parrish, M.D. LIO/MEDQ  D:  09/29/2011  T:  09/29/2011  Job:  [id number]

## 2011-09-29 NOTE — MAU Provider Note (Signed)
Chief Complaint:  Abdominal Pain    First Provider Initiated Contact with Patient 09/29/11 2011      Tara Parrish is  21 y.o. [id number].  Patient's last menstrual period was 09/26/2011.Marland Kitchen  Her pregnancy status is negative.  She presents complaining of Abdominal Pain  Pt was sent from [facility] for futher evaluation of leukocytosis believed to be related to Gyn issue. Pt reports pelvic pain 4/10 x 3 weeks. Intermittent mild cramping related to menses that started 3 days ago, fever, chills, nausea and no vomiting.  Obstetrical/Gynecological History: OB History    Grav Para Term Preterm Abortions TAB SAB Ect Mult Living   2 2 2  0 0 0 0 0 0 2      Past Medical History: Past Medical History  Diagnosis Date  . Pregnancy induced hypertension   . Chlamydia 11/2010    treated    Past Surgical History: Past Surgical History  Procedure Date  . No past surgeries     Family History: History reviewed. No pertinent family history.  Social History: History  Substance Use Topics  . Smoking status: Former Smoker -- 0.2 packs/day for 1 years    Quit date: 09/06/2010  . Smokeless tobacco: Never Used  . Alcohol Use: No    Allergies: No Known Allergies  Prescriptions prior to admission  Medication Sig Dispense Refill  . ibuprofen (ADVIL,MOTRIN) 200 MG tablet Take 400-800 mg by mouth every 6 (six) hours as needed. For pain      . potassium chloride (K-DUR) 10 MEQ tablet Take 2 tablets (20 mEq total) by mouth 2 (two) times daily.  10 tablet  0  . traMADol (ULTRAM) 50 MG tablet Take 50 mg by mouth every 6 (six) hours as needed. Take 1 tablet every  6 - 8 hrs prn.        Review of Systems - History obtained from the patient General ROS: positive for  - chills, fatigue and fever negative for - hot flashes, weight gain or weight loss Hematological and Lymphatic ROS: negative for - swollen lymph nodes Cardiovascular ROS: no chest pain or dyspnea on exertion Gastrointestinal ROS:  positive for - abdominal pain and nausea negative for - change in bowel habits, change in stools, constipation or diarrhea Genito-Urinary ROS: no dysuria, trouble voiding, or hematuria positive for - menses  Physical Exam   Blood pressure 144/75, pulse 97, temperature 101.1 F (38.4 C), temperature source Oral, resp. rate 18, height 5\' 6"  (1.676 m), weight 188 lb (85.276 kg), last menstrual period 09/26/2011, SpO2 100.00%, unknown if currently breastfeeding.  General: General appearance - alert, well appearing, and in no distress, oriented to person, place, and time and overweight Mental status - alert, oriented to person, place, and time, normal mood, behavior, speech, dress, motor activity, and thought processes, affect appropriate to mood Abdomen - soft, nontender, nondistended, no masses or organomegaly Focused Gynecological Exam: VULVA: normal appearing vulva with no masses, tenderness or lesions, VAGINA: scant menses, CERVIX: normal appearing cervix without discharge or lesions, cervical motion tenderness present, UTERUS: uterus is normal size, shape, consistency and nontender, ADNEXA: normal adnexa in size, nontender and no masses  Labs: Recent Results (from the past 24 hour(s))  CBC WITH DIFFERENTIAL   Collection Time   09/29/11 11:11 AM      Component Value Range   WBC 15.0 (*) 3.9 - 10.3 (10e3/uL)   NEUT# 12.3 (*) 1.5 - 6.5 (10e3/uL)   HGB 10.9 (*) 11.6 - 15.9 (g/dL)  HCT 32.9 (*) 34.8 - 46.6 (%)   Platelets 470 (*) 145 - 400 (10e3/uL)   MCV 88.1  79.5 - 101.0 (fL)   MCH 29.3  25.1 - 34.0 (pg)   MCHC 33.2  31.5 - 36.0 (g/dL)   RBC 4.09  8.11 - 9.14 (10e6/uL)   RDW 12.9  11.2 - 14.5 (%)   lymph# 1.6  0.9 - 3.3 (10e3/uL)   MONO# 0.9  0.1 - 0.9 (10e3/uL)   Eosinophils Absolute 0.0  0.0 - 0.5 (10e3/uL)   Basophils Absolute 0.1  0.0 - 0.1 (10e3/uL)   NEUT% 82.3 (*) 38.4 - 76.8 (%)   LYMPH% 10.7 (*) 14.0 - 49.7 (%)   MONO% 6.2  0.0 - 14.0 (%)   EOS% 0.3  0.0 - 7.0 (%)    BASO% 0.5  0.0 - 2.0 (%)   nRBC 0  0 - 0 (%)  MORPHOLOGY   Collection Time   09/29/11 11:11 AM      Component Value Range   RBC Comments Within Normal Limits  Within Normal Limits    White Cell Comments C/W auto diff     Other Comments Rare Meta on Scan     PLT EST Increased  Adequate   URINALYSIS, ROUTINE W REFLEX MICROSCOPIC   Collection Time   09/29/11  7:15 PM      Component Value Range   Color, Urine ORANGE (*) YELLOW    APPearance HAZY (*) CLEAR    Specific Gravity, Urine >1.030 (*) 1.005 - 1.030    pH 6.0  5.0 - 8.0    Glucose, UA NEGATIVE  NEGATIVE (mg/dL)   Hgb urine dipstick TRACE (*) NEGATIVE    Bilirubin Urine SMALL (*) NEGATIVE    Ketones, ur NEGATIVE  NEGATIVE (mg/dL)   Protein, ur NEGATIVE  NEGATIVE (mg/dL)   Urobilinogen, UA 4.0 (*) 0.0 - 1.0 (mg/dL)   Nitrite NEGATIVE  NEGATIVE    Leukocytes, UA TRACE (*) NEGATIVE   URINE MICROSCOPIC-ADD ON   Collection Time   09/29/11  7:15 PM      Component Value Range   Squamous Epithelial / LPF FEW (*) RARE    WBC, UA 3-6  <3 (WBC/hpf)   RBC / HPF 0-2  <3 (RBC/hpf)   Bacteria, UA FEW (*) RARE    Urine-Other MUCOUS PRESENT    WET PREP, GENITAL   Collection Time   09/29/11  8:15 PM      Component Value Range   Yeast Wet Prep HPF POC FEW (*) NONE SEEN    Trich, Wet Prep NONE SEEN  NONE SEEN    Clue Cells Wet Prep HPF POC FEW (*) NONE SEEN    WBC, Wet Prep HPF POC MODERATE (*) NONE SEEN    Imaging Studies:  Dg Chest 2 View  09/16/2011  *RADIOLOGY REPORT*  Clinical Data: Right-sided lower chest pain for 1 week  CHEST - 2 VIEW  Comparison: CT abdomen pelvis 09/13/2011.  Findings: Slight elevation right hemidiaphragm does not appear to be accompanied by significant atelectasis or infiltrate.  There is no effusion or pneumothorax.  Low lung volumes accentuate cardiac size.  Left lung is clear.  There is no pneumothorax.  No osseous findings.  IMPRESSION: Slight elevation right hemidiaphragm without definite underlying  cardiopulmonary disease.  Poor inspiratory effort.  Original Report Authenticated By: Elsie Stain, M.D.   Ct Angio Chest W/cm &/or Wo Cm  09/16/2011  *RADIOLOGY REPORT*  Clinical Data: Pain under left breast.  Question  acute pulmonary embolism.  CT ANGIOGRAPHY CHEST  Technique:  Multidetector CT imaging of the chest using the standard protocol during bolus administration of intravenous contrast. Multiplanar reconstructed images including MIPs were obtained and reviewed to evaluate the vascular anatomy.  Contrast: OMNIPAQUE IOHEXOL 300 MG/ML  SOLN total contrast volume; study was repeated due to limited opacification of the pulmonary arteries on the initial scan.  Comparison: Chest radiographs 09/16/2011.  Findings: Despite repeating the examination, opacification of the pulmonary arteries is suboptimal. There is no evidence of acute pulmonary embolism.  The thoracic aorta and great vessels appear normal.  There is residual thymic tissue within the anterior mediastinum.  No enlarged mediastinal or hilar lymph nodes are present.  There is no pleural or pericardial effusion. No chest wall abnormalities are seen.  2 mm right upper lobe nodule (image 31 of series 4) is likely a small granuloma.  The lungs are otherwise clear.  The visualized upper abdomen appears unremarkable.  IMPRESSION: No evidence of acute pulmonary embolism or other acute chest process.  Original Report Authenticated By: Gerrianne Scale, M.D.   US Abdomen Complete  09/16/2011  *RADIOLOGY REPORT*  Clinical Data:  Abdominal pain.  ABDOMINAL ULTRASOUND COMPLETE  Comparison:  None.  Findings:  Gallbladder:  No gallstones, gallbladder wall thickening, or pericholecystic fluid.  Common Bile Duct:  Within normal limits in caliber.  Liver: No focal mass lesion identified.  Within normal limits in parenchymal echogenicity.  IVC:  Appears normal.  Pancreas:  No abnormality identified.  Spleen:  Within normal limits in size and echotexture.   Right kidney:  Normal in size and parenchymal echogenicity.  No evidence of mass or hydronephrosis.  Left kidney:  Normal in size and parenchymal echogenicity.  No evidence of mass or hydronephrosis.  Abdominal Aorta:  No aneurysm identified.  IMPRESSION: Negative abdominal ultrasound.  Original Report Authenticated By: Elsie Stain, M.D.   Ct Abdomen Pelvis W Contrast  09/13/2011  *RADIOLOGY REPORT*  Clinical Data: Three day onset of gradually worsening upper abdominal pain with constipation.  CT ABDOMEN AND PELVIS WITH CONTRAST  Technique:  Multidetector CT imaging of the abdomen and pelvis was performed following the standard protocol during bolus administration of intravenous contrast.  Contrast: 75mL OMNIPAQUE IOHEXOL 300 MG/ML  SOLN  Comparison: None.  Findings: The lung bases are clear.  There is no pleural effusion. The liver, gallbladder, biliary system and pancreas appear normal. The spleen, kidneys and adrenal glands appear normal.  There is no hydronephrosis or perinephric soft tissue stranding.  The bowel gas pattern is normal.  The appendix appears normal.  No inflammatory changes are identified.  The uterus appears normal. There are no suspicious adnexal findings.  There is a small left pelvic calcification on image 73.  The relationship to the distal left ureter is difficult to define, although this is likely a phlebolith.  No osseous abnormalities are seen.  IMPRESSION:  1.  No acute abdominal pelvic findings identified. 2.  Small left pelvic calcification appears to be external to the left ureter and is probably a phlebolith.  No hydronephrosis.  Original Report Authenticated By: Gerrianne Scale, M.D.     Assessment: 1. PID (acute pelvic inflammatory disease)     Plan: Discharge home Rocephin IM in MAU Rx Doxycycline, Flagyl, Diflucan sent to pharmacy FU in clinic, message sent to schedule  .  Tara Parrish E. 09/29/2011,9:12 PM

## 2011-09-29 NOTE — Progress Notes (Signed)
This office note has been dictated.

## 2011-09-29 NOTE — Discharge Instructions (Signed)
Pelvic Inflammatory Disease Pelvic inflammatory disease (PID) is an infection of some, or all, of the female pelvic organs. PID is caused by germs. HOME CARE  Take your medicine as told. Finish them even if you start to feel better.   Only take medicine as told by your doctor.   Do not have sex (intercourse) until the infection is gone.   Tell your sex partner you have PID. Treatment may be needed.   Keep your follow-up appointments.  GET HELP RIGHT AWAY IF:   You have a fever.   You have an increase in belly (abdominal) pain.   You start to get the chills.   You have pain when you pee (urinate).   You are not better after 72 hours.   Your sex partner tells you he or she has an STD.   You throw up (vomit).   You cannot take your medicines.  MAKE SURE YOU:   Understand these instructions.   Will watch your condition.   Will get help right away if you are not doing well or get worse.  Document Released: 07/16/2008 Document Revised: 04/08/2011 Document Reviewed: 08/19/2009 Exit[employer] Patient Information 2012 [employer]. 

## 2011-09-29 NOTE — Progress Notes (Signed)
NO    PRIMARY.  [facility]  On  [address]  Or   [facility]  On  [address].  Cell    Phone      [phone].

## 2011-09-30 LAB — GC/CHLAMYDIA PROBE AMP, GENITAL
Chlamydia, DNA Probe: POSITIVE — AB
GC Probe Amp, Genital: POSITIVE — AB

## 2011-10-01 LAB — URINE CULTURE: Culture  Setup Time: 201305300231

## 2011-10-01 LAB — SEDIMENTATION RATE: Sed Rate: 119 mm/h — ABNORMAL HIGH (ref 0–22)

## 2011-10-01 LAB — LEUKOCYTE ALKALINE PHOSPHATASE: Leukocyte Alkaline Phos Stain: 212 — ABNORMAL HIGH (ref 30–140)

## 2011-10-21 ENCOUNTER — Encounter: Payer: Self-pay | Admitting: Advanced Practice Midwife

## 2012-01-17 ENCOUNTER — Emergency Department (HOSPITAL_COMMUNITY)
Admission: EM | Admit: 2012-01-17 | Discharge: 2012-01-17 | Disposition: A | Discharge status: Home / Self Care | Payer: Self-pay | Attending: Emergency Medicine | Admitting: Emergency Medicine

## 2012-01-17 ENCOUNTER — Encounter (HOSPITAL_COMMUNITY): Payer: Self-pay

## 2012-01-17 DIAGNOSIS — R070 Pain in throat: Secondary | ICD-10-CM | POA: Insufficient documentation

## 2012-01-17 DIAGNOSIS — J029 Acute pharyngitis, unspecified: Secondary | ICD-10-CM

## 2012-01-17 DIAGNOSIS — Z87891 Personal history of nicotine dependence: Secondary | ICD-10-CM | POA: Insufficient documentation

## 2012-01-17 MED ORDER — IBUPROFEN 400 MG PO TABS
800.0000 mg | ORAL_TABLET | Freq: Once | ORAL | Status: AC
  Administered 2012-01-17: 800 mg via ORAL
  Filled 2012-01-17: qty 4

## 2012-01-17 MED ORDER — AMOXICILLIN 500 MG PO CAPS: 500.0000 mg | ORAL_CAPSULE | Freq: Three times a day (TID) | ORAL | Status: DC

## 2012-01-17 MED ORDER — IBUPROFEN 600 MG PO TABS: 600.0000 mg | ORAL_TABLET | Freq: Four times a day (QID) | ORAL | Status: DC | PRN

## 2012-01-17 NOTE — ED Notes (Signed)
Patient has been having a sore throat, fever, difficulty swallowing for the past 3 days.  States her throat hurts and behind her left ear.

## 2012-01-17 NOTE — ED Notes (Signed)
Pt reports sore throat, increase pain w/swallowing, and (R) ear pain x3 days

## 2012-01-17 NOTE — ED Provider Notes (Signed)
History  This chart was scribed for Richardean Canal, MD by Bennett Scrape. This patient was seen in room TR05C/TR05C and the patient's care was started at 2228.  CSN: [id number]  Arrival date & time 01/17/12  4098   First MD Initiated Contact with Patient 01/17/12 2228      Chief Complaint  Patient presents with  . Sore Throat    The history is provided by the patient. No language interpreter was used.    Tara Parrish is a 21 y.o. female who presents to the Emergency Department complaining of 3 to 4 days of sore throat with associated right otalgia. The pain is worse with swallowing and yawning. She reports trying throat spray, salt water and tea with no improvement in symptoms. She reports having a unmeasured fever last night that has since resolved. Temperature was measured 99 in the ED. She denies having a h/o strep throat or prior episodes of similar symptoms. She denies having any sick contacts with similar symptoms. She denies cough, congestion, nausea and emesis as associated symptoms.  She is a former smoker but denies alcohol use.    Past Medical History  Diagnosis Date  . Pregnancy induced hypertension   . Chlamydia 11/2010    treated    Past Surgical History  Procedure Date  . No past surgeries     History reviewed. No pertinent family history.  History  Substance Use Topics  . Smoking status: Former Smoker -- 0.2 packs/day for 1 years    Quit date: 09/06/2010  . Smokeless tobacco: Never Used  . Alcohol Use: No    OB History    Grav Para Term Preterm Abortions TAB SAB Ect Mult Living   2 2 2  0 0 0 0 0 0 2      Review of Systems  Constitutional: Positive for fever (resolved). Negative for chills.  HENT: Positive for ear pain (right) and sore throat. Negative for congestion and trouble swallowing.   Respiratory: Negative for cough and shortness of breath.   Gastrointestinal: Negative for nausea and vomiting.  Skin: Negative for rash.    Allergies    Review of patient's allergies indicates no known allergies.  Home Medications   Current Outpatient Rx  Name Route Sig Dispense Refill  . AMOXICILLIN 500 MG PO CAPS Oral Take 1 capsule (500 mg total) by mouth 3 (three) times daily. 21 capsule 0  . IBUPROFEN 600 MG PO TABS Oral Take 1 tablet (600 mg total) by mouth every 6 (six) hours as needed for pain. 30 tablet 0    Filed Vitals:   01/17/12 2012  BP: 161/107  Pulse: 89  Temp: 99 F (37.2 C)  Resp: 18    Physical Exam  Nursing note and vitals reviewed. Constitutional: She is oriented to person, place, and time. She appears well-developed and well-nourished. No distress.  HENT:  Head: Normocephalic and atraumatic.       Minimal oropharynx erythema, no tonsillar exudate, no enlarged tonsils, TMs are normal  Eyes: Conjunctivae normal and EOM are normal.  Neck: Neck supple. No tracheal deviation present.  Cardiovascular: Normal rate and regular rhythm.   Pulmonary/Chest: Effort normal and breath sounds normal. No respiratory distress.  Abdominal: She exhibits no distension.  Musculoskeletal: Normal range of motion.  Lymphadenopathy:    She has cervical adenopathy (mild on the left).  Neurological: She is alert and oriented to person, place, and time.  Skin: Skin is warm and dry.  Psychiatric: She has a  normal mood and affect. Her behavior is normal.    ED Course  Procedures (including critical care time)  DIAGNOSTIC STUDIES: Oxygen Saturation is 100% on room air, normal by my interpretation.    COORDINATION OF CARE: 2240-Informed pt that her strep test was negative. Discussed discharge plan which includes Motrin with pt at bedside and pt agreed to plan.    Labs Reviewed  RAPID STREP SCREEN   No results found.   1. Sore throat       MDM  Tara Parrish is a 21 y.o. female here with sore throat. She has 2/4 centor criteria and rapid strep negative. I counseled patient that its likely viral. Recommend  supportive treatment. I still gave her a script of amoxicillin and told her that if she doesn't feel better in several days, she can fill the script. Patient understands instructions.   This document was completed by the scribe at my direction and I have reviewed its accuracy. I have personally examined the patient and agrees with the above document.   Chaney Malling, MD         Richardean Canal, MD 01/17/12 [phone]

## 2012-09-12 IMAGING — CT CT ANGIO CHEST
3 of 10 series · 11 of 32 positions shown · IV contrast (omnipaque)
Comparison: Chest radiographs 09/16/2011.

CLINICAL DATA: Pain under left breast.  Question acute pulmonary
embolism.

CT ANGIOGRAPHY CHEST
TECHNIQUE: Multidetector CT imaging of the chest using the
standard protocol during bolus administration of intravenous
contrast. Multiplanar reconstructed images including MIPs were
obtained and reviewed to evaluate the vascular anatomy.
Contrast: 200mL OMNIPAQUE IOHEXOL 300 MG/ML  SOLN total contrast
volume; study was repeated due to limited opacification of the
pulmonary arteries on the initial scan.

[Series 3: pe · axial · 0.75mm/px · z∈[-216,-126]mm · 3 of 110 slices shown]
[im 37/110  lung]
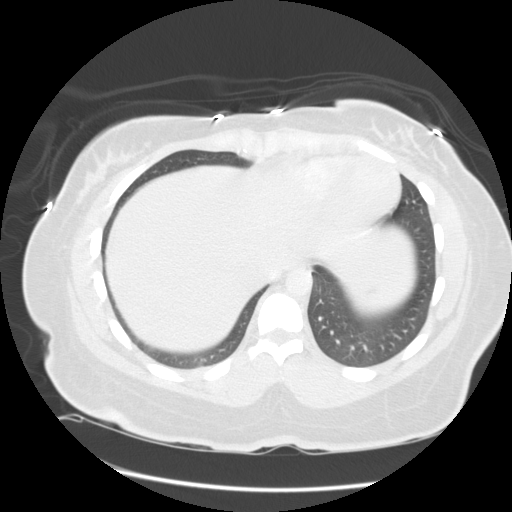
[im 63/110  lung]
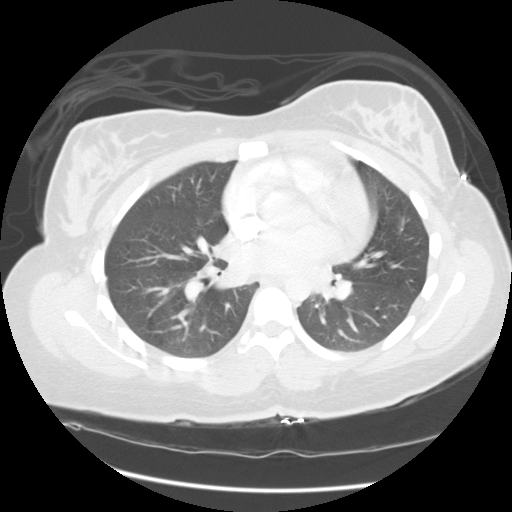
[im 73/110  lung]
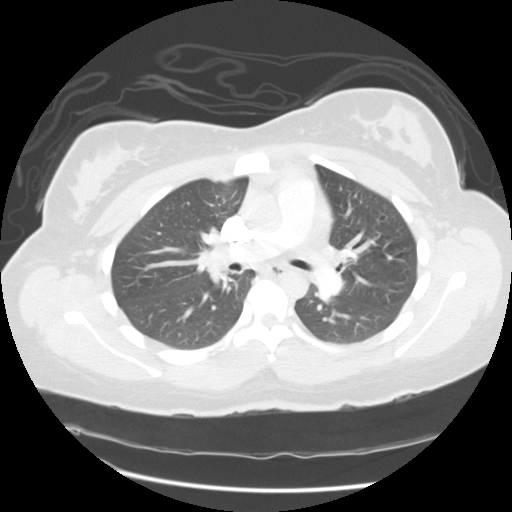

[Series 5: recon 3: pe · axial · 0.75mm/px · z∈[-252,-89]mm · 5 of 273 slices shown]
[im 55/273  lung]
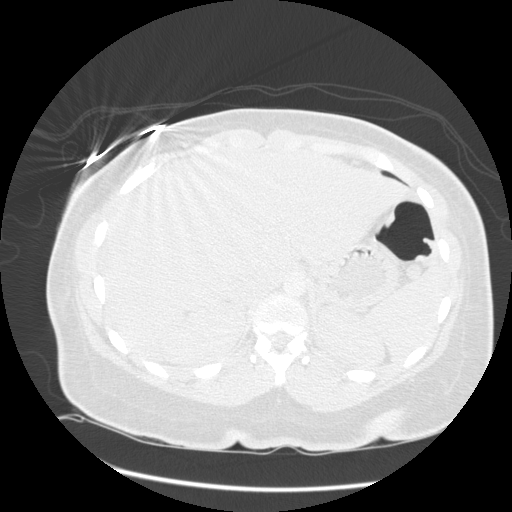
[im 109/273  mediastinal]
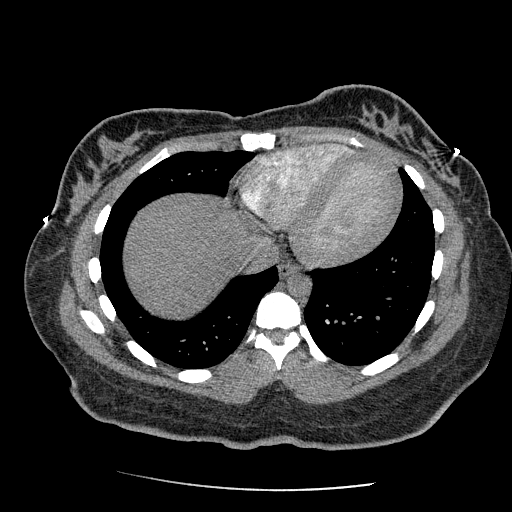
[im 155/273  lung]
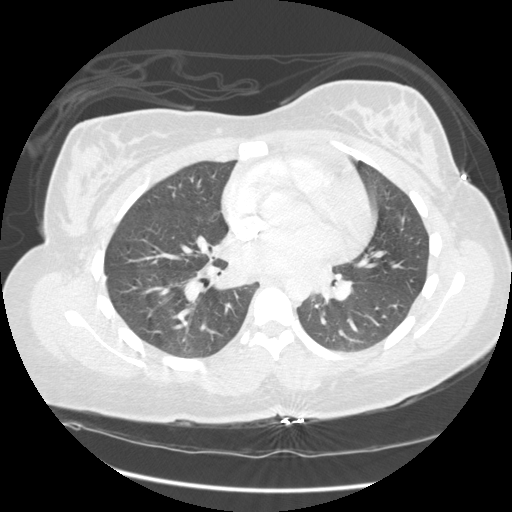
[im 164/273  mediastinal]
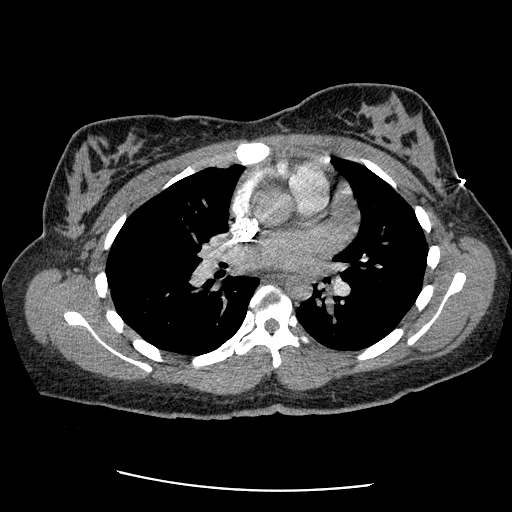
[im 218/273  lung]
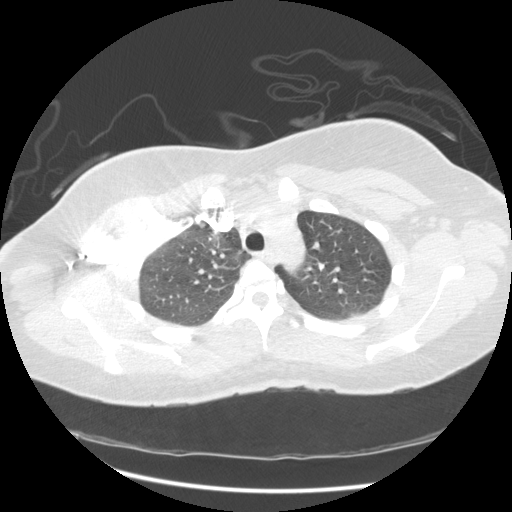

[Series 8: recon 2: pe · axial · 0.78mm/px · z∈[-221,-149]mm · 3 of 175 slices shown]
[im 59/175  lung]
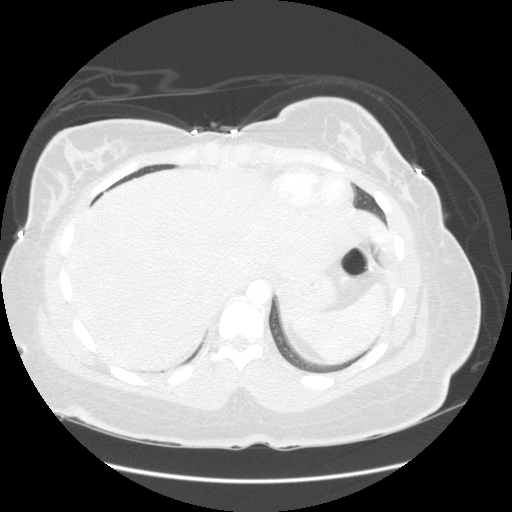
[im 115/175  lung]
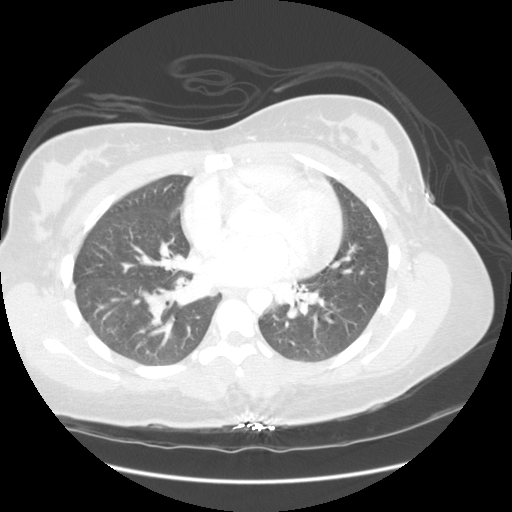
[im 117/175  lung]
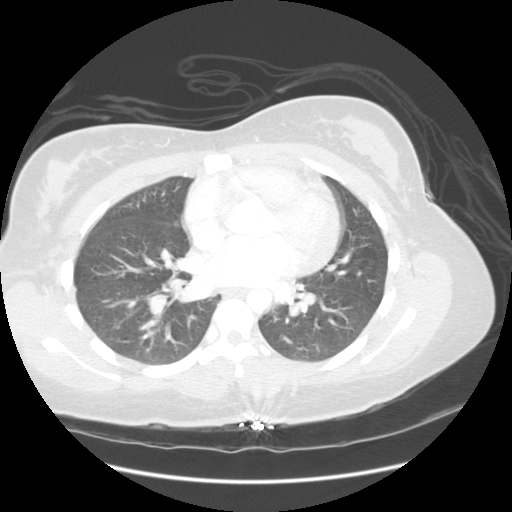

[11 of 32 positions shown; findings below may reference images not displayed]

FINDINGS: Despite repeating the examination, opacification of the
pulmonary arteries is suboptimal. There is no evidence of acute
pulmonary embolism.  The thoracic aorta and great vessels appear
normal.  There is residual thymic tissue within the anterior
mediastinum.

No enlarged mediastinal or hilar lymph nodes are present.  There is
no pleural or pericardial effusion. No chest wall abnormalities are
seen.

2 mm right upper lobe nodule (image 31 of series 4) is likely a
small granuloma.  The lungs are otherwise clear.  The visualized
upper abdomen appears unremarkable.
IMPRESSION: No evidence of acute pulmonary embolism or other acute chest
process.

## 2012-09-12 IMAGING — CR DG CHEST 2V
2 series · 2 of 2 positions shown · non-contrast
Comparison: CT abdomen pelvis 09/13/2011.

CLINICAL DATA: Right-sided lower chest pain for 1 week

CHEST - 2 VIEW

[w chest pa]
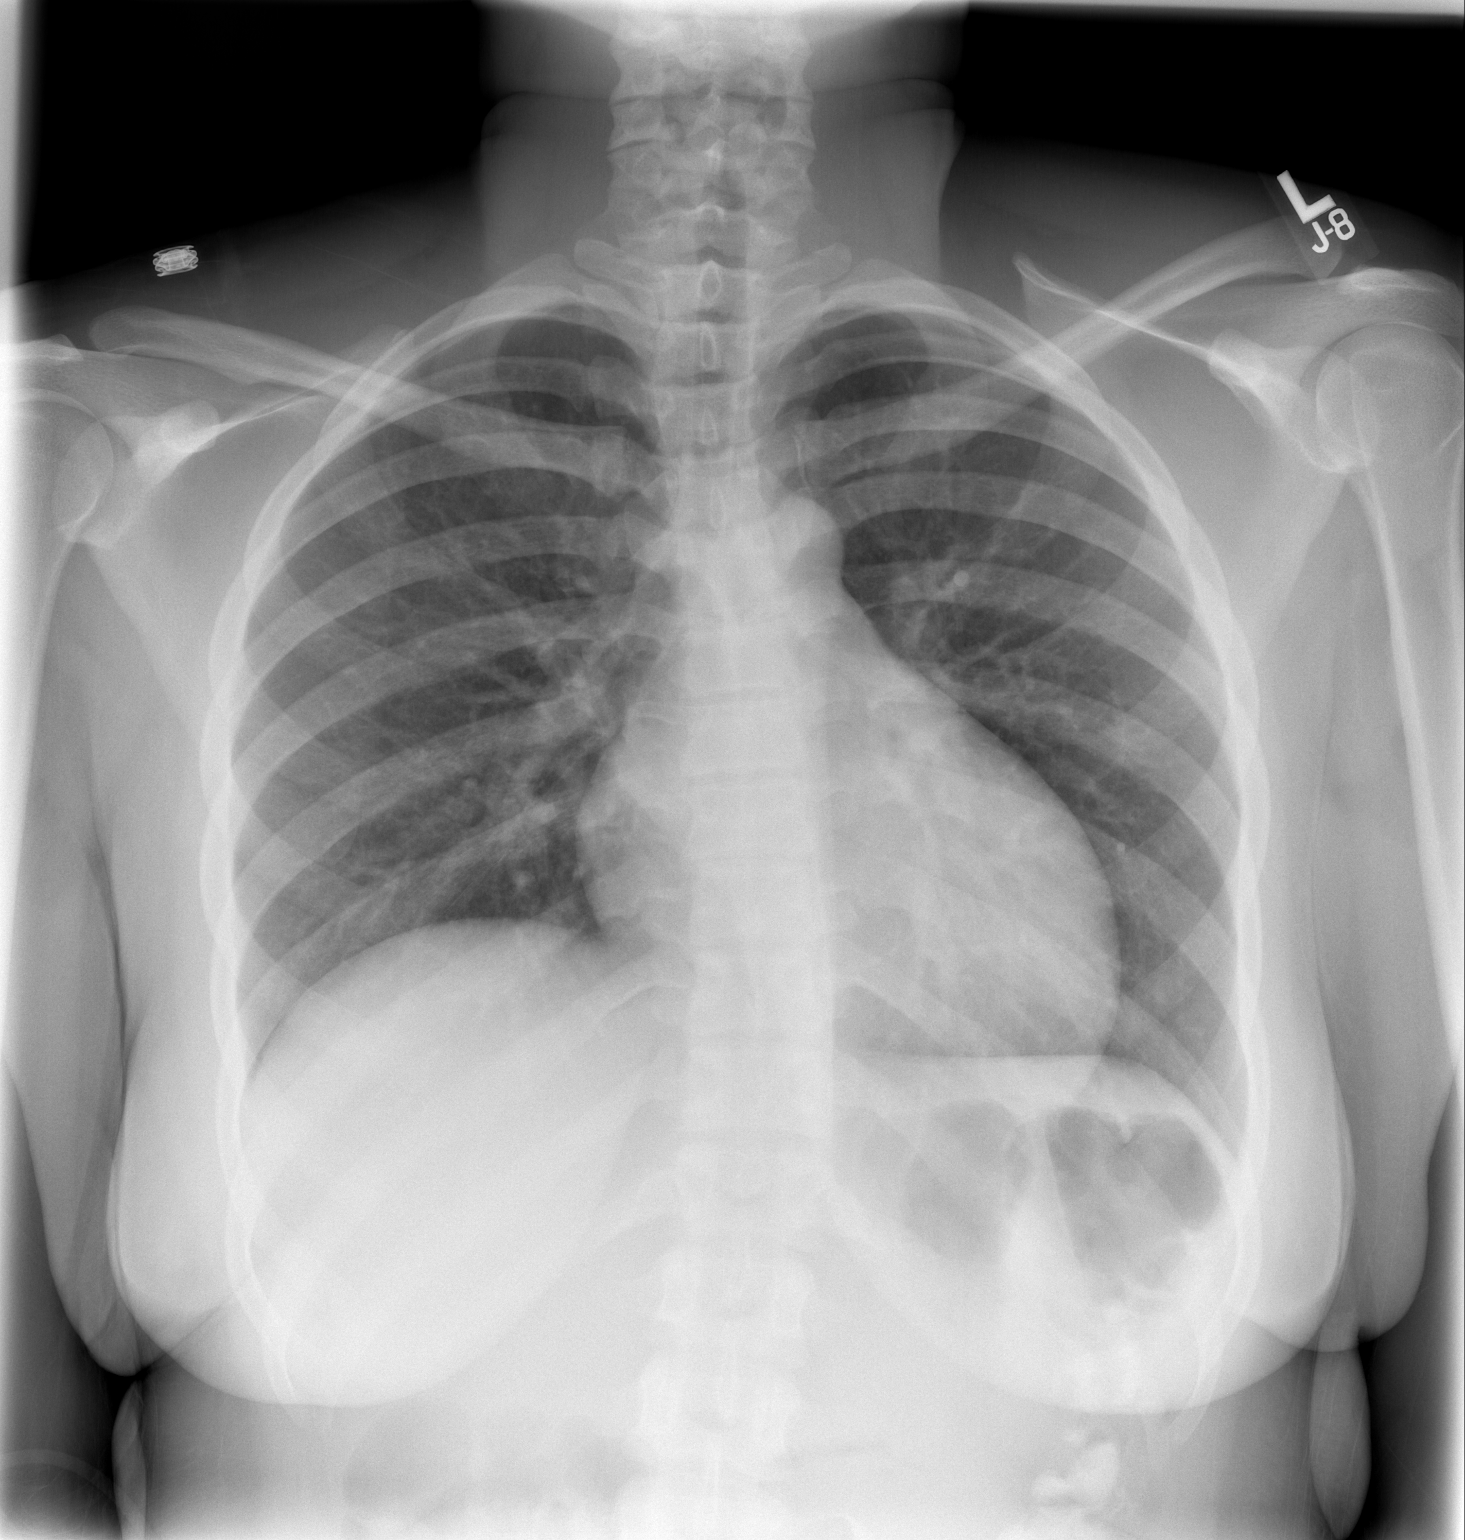

[w chest lat]
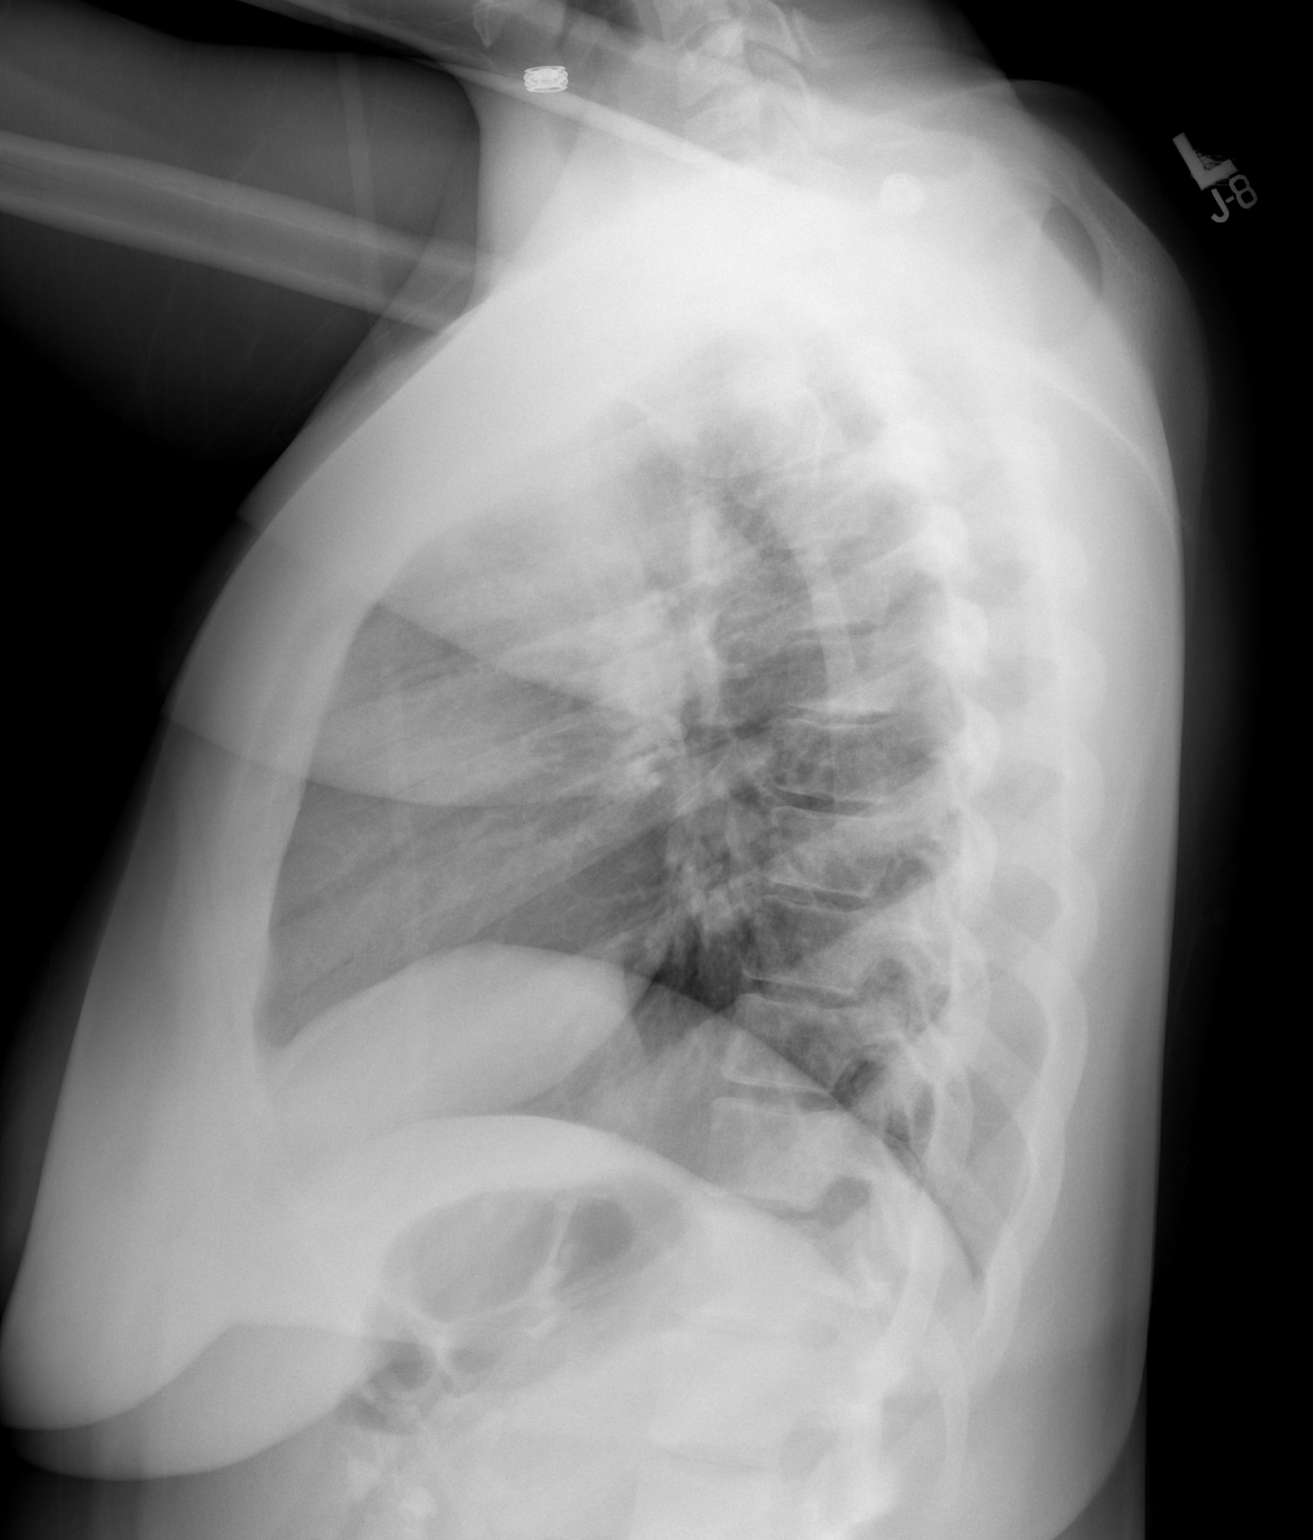

[2 of 2 positions shown; findings below may reference images not displayed]

FINDINGS: Slight elevation right hemidiaphragm does not appear to
be accompanied by significant atelectasis or infiltrate.  There is
no effusion or pneumothorax.  Low lung volumes accentuate cardiac
size.  Left lung is clear.  There is no pneumothorax.  No osseous
findings.
IMPRESSION: Slight elevation right hemidiaphragm without definite underlying
cardiopulmonary disease.  Poor inspiratory effort.

## 2013-05-03 NOTE — L&D Delivery Note (Addendum)
Delivery Note Patient is 23 y.o. [id number] [redacted]w[redacted]d induction of labor 2/2 preEclampsia with severe range blood pressures, 24h urine protein , currently on labetalol and procardia with elevated blood pressures, no HA/RUQ/edema, +hyperreflexic at time of admission.    At 10:59 AM a viable female was delivered via Vaginal, Spontaneous Delivery (Presentation: Left Occiput Anterior).  APGAR: 8, 8; weight 4 lb 13.4 oz (2195 g).   Placenta status: Intact, Spontaneous.  Cord: 3 vessels with the following complications:   Anesthesia:  epidural Episiotomy: none Lacerations: very small hemostatic 1st degree Suture Repair: 3.0 vicryl rapide figure of eight x 1 Est. Blood Loss (mL): 200  Mom to postpartum.  Baby to Couplet care / Skin to Skin for 5 minutes then to NICU  Tara Parrish ROCIO 01/28/2014, 11:44 AM

## 2013-10-12 ENCOUNTER — Encounter (HOSPITAL_COMMUNITY): Payer: Self-pay

## 2013-10-12 ENCOUNTER — Inpatient Hospital Stay (HOSPITAL_COMMUNITY)
Admission: AD | Admit: 2013-10-12 | Discharge: 2013-10-12 | Disposition: A | Discharge status: Home / Self Care | Payer: Medicaid Other | Source: Ambulatory Visit | Attending: Obstetrics & Gynecology | Admitting: Obstetrics & Gynecology

## 2013-10-12 DIAGNOSIS — Z87891 Personal history of nicotine dependence: Secondary | ICD-10-CM | POA: Insufficient documentation

## 2013-10-12 DIAGNOSIS — O9989 Other specified diseases and conditions complicating pregnancy, childbirth and the puerperium: Principal | ICD-10-CM

## 2013-10-12 DIAGNOSIS — Z3490 Encounter for supervision of normal pregnancy, unspecified, unspecified trimester: Secondary | ICD-10-CM

## 2013-10-12 DIAGNOSIS — Z3201 Encounter for pregnancy test, result positive: Secondary | ICD-10-CM

## 2013-10-12 DIAGNOSIS — O99891 Other specified diseases and conditions complicating pregnancy: Secondary | ICD-10-CM | POA: Insufficient documentation

## 2013-10-12 DIAGNOSIS — Z3492 Encounter for supervision of normal pregnancy, unspecified, second trimester: Secondary | ICD-10-CM

## 2013-10-12 NOTE — MAU Provider Note (Signed)
Attestation of Attending Supervision of Obstetric Fellow: Evaluation and management procedures were performed by the Obstetric Fellow under my supervision and collaboration.  I have reviewed the Obstetric Fellow's note and chart, and I agree with the management and plan.  Aarushi Hemric, MD, FACOG Attending Obstetrician & Gynecologist Faculty Practice, [facility]   

## 2013-10-12 NOTE — MAU Note (Signed)
Pt states here for pregnancy test. Has not taken test at home, however feels pregnant, nipples sore. LMP-February, hx irregular cycles.

## 2013-10-12 NOTE — MAU Provider Note (Signed)
  History     CSN: [id number]  Arrival date and time: 10/12/13 1333   None     Chief Complaint  Patient presents with  . Pregnancy Test    HPI Tara Parrish is 23 y.o. [id number] 2784w2d weeks presenting for pregnancy confirmation.  LMP 06/30/13.  States she thinks she is pregnancy because she has missed several periods.  + for breast tenderness, frequency of urination without dysuria.  Denies abdominal pain and vaginal bleeding.  [facility] where she has been a patient in the past.  Unplanned pregnancy, but happy.  Was not using contraception.  Is not on Vitamins.  She would like to use the generic vitamins.  She declined pelvic exam-just wants confirmation of pregnancy.   Past Medical History  Diagnosis Date  . Pregnancy induced hypertension   . Chlamydia 11/2010    treated    Past Surgical History  Procedure Laterality Date  . No past surgeries      No family history on file.  History  Substance Use Topics  . Smoking status: Former Smoker -- 0.25 packs/day for 1 years    Quit date: 09/06/2010  . Smokeless tobacco: Never Used  . Alcohol Use: No    Allergies: No Known Allergies  Prescriptions prior to admission  Medication Sig Dispense Refill  . amoxicillin (AMOXIL) 500 MG capsule Take 1 capsule (500 mg total) by mouth 3 (three) times daily.  21 capsule  0  . ibuprofen (ADVIL,MOTRIN) 600 MG tablet Take 1 tablet (600 mg total) by mouth every 6 (six) hours as needed for pain.  30 tablet  0    Review of Systems  Constitutional: Negative for fever and chills.  Gastrointestinal: Negative for nausea, vomiting and abdominal pain.  Genitourinary: Positive for frequency. Negative for dysuria, urgency and hematuria.       Neg for vaginal bleeding.   Physical Exam   Blood pressure 139/86, pulse 73, temperature 98.2 F (36.8 C), temperature source Oral, resp. rate 16, height 5' 5.5" (1.664 m), weight 199 lb 4 oz (90.379 kg), last menstrual period  06/27/2013.  Physical Exam  Constitutional: She is oriented to person, place, and time. She appears well-developed and well-nourished. No distress.  Genitourinary:  Not indicated   Neurological: She is alert and oriented to person, place, and time.  Psychiatric: She has a normal mood and affect. Her behavior is normal.    MAU Course  Procedures  MDM   Assessment and Plan  A:  + Fetal Heart Tones on Doppler      Second trimester by dates  P:  Strongly encouraged prenatal vitamins daily--she prefers generic OTC      Patient to call clinic to make appointment with clinic  Adileny Delon,EVE M 10/12/2013, 2:02 PM

## 2013-10-12 NOTE — Discharge Instructions (Signed)
Second Trimester of Pregnancy The second trimester is from week 13 through week 28, months 4 through 6. The second trimester is often a time when you feel your best. Your body has also adjusted to being pregnant, and you begin to feel better physically. Usually, morning sickness has lessened or quit completely, you may have more energy, and you may have an increase in appetite. The second trimester is also a time when the fetus is growing rapidly. At the end of the sixth month, the fetus is about 9 inches long and weighs about 1 pounds. You will likely begin to feel the baby move (quickening) between 18 and 20 weeks of the pregnancy. BODY CHANGES Your body goes through many changes during pregnancy. The changes vary from woman to woman.   Your weight will continue to increase. You will notice your lower abdomen bulging out.  You may begin to get stretch marks on your hips, abdomen, and breasts.  You may develop headaches that can be relieved by medicines approved by your caregiver.  You may urinate more often because the fetus is pressing on your bladder.  You may develop or continue to have heartburn as a result of your pregnancy.  You may develop constipation because certain hormones are causing the muscles that push waste through your intestines to slow down.  You may develop hemorrhoids or swollen, bulging veins (varicose veins).  You may have back pain because of the weight gain and pregnancy hormones relaxing your joints between the bones in your pelvis and as a result of a shift in weight and the muscles that support your balance.  Your breasts will continue to grow and be tender.  Your gums may bleed and may be sensitive to brushing and flossing.  Dark spots or blotches (chloasma, mask of pregnancy) may develop on your face. This will likely fade after the baby is born.  A dark line from your belly button to the pubic area (linea nigra) may appear. This will likely fade after the  baby is born. WHAT TO EXPECT AT YOUR PRENATAL VISITS During a routine prenatal visit:  You will be weighed to make sure you and the fetus are growing normally.  Your blood pressure will be taken.  Your abdomen will be measured to track your baby's growth.  The fetal heartbeat will be listened to.  Any test results from the previous visit will be discussed. Your caregiver may ask you:  How you are feeling.  If you are feeling the baby move.  If you have had any abnormal symptoms, such as leaking fluid, bleeding, severe headaches, or abdominal cramping.  If you have any questions. Other tests that may be performed during your second trimester include:  Blood tests that check for:  Low iron levels (anemia).  Gestational diabetes (between 24 and 28 weeks).  Rh antibodies.  Urine tests to check for infections, diabetes, or protein in the urine.  An ultrasound to confirm the proper growth and development of the baby.  An amniocentesis to check for possible genetic problems.  Fetal screens for spina bifida and Down syndrome. HOME CARE INSTRUCTIONS   Avoid all smoking, herbs, alcohol, and unprescribed drugs. These chemicals affect the formation and growth of the baby.  Follow your caregiver's instructions regarding medicine use. There are medicines that are either safe or unsafe to take during pregnancy.  Exercise only as directed by your caregiver. Experiencing uterine cramps is a good sign to stop exercising.  Continue to eat regular,   healthy meals.  Wear a good support bra for breast tenderness.  Do not use hot tubs, steam rooms, or saunas.  Wear your seat belt at all times when driving.  Avoid raw meat, uncooked cheese, cat litter boxes, and soil used by cats. These carry germs that can cause birth defects in the baby.  Take your prenatal vitamins.  Try taking a stool softener (if your caregiver approves) if you develop constipation. Eat more high-fiber foods,  such as fresh vegetables or fruit and whole grains. Drink plenty of fluids to keep your urine clear or pale yellow.  Take warm sitz baths to soothe any pain or discomfort caused by hemorrhoids. Use hemorrhoid cream if your caregiver approves.  If you develop varicose veins, wear support hose. Elevate your feet for 15 minutes, 3 4 times a day. Limit salt in your diet.  Avoid heavy lifting, wear low heel shoes, and practice good posture.  Rest with your legs elevated if you have leg cramps or low back pain.  Visit your dentist if you have not gone yet during your pregnancy. Use a soft toothbrush to brush your teeth and be gentle when you floss.  A sexual relationship may be continued unless your caregiver directs you otherwise.  Continue to go to all your prenatal visits as directed by your caregiver. SEEK MEDICAL CARE IF:   You have dizziness.  You have mild pelvic cramps, pelvic pressure, or nagging pain in the abdominal area.  You have persistent nausea, vomiting, or diarrhea.  You have a bad smelling vaginal discharge.  You have pain with urination. SEEK IMMEDIATE MEDICAL CARE IF:   You have a fever.  You are leaking fluid from your vagina.  You have spotting or bleeding from your vagina.  You have severe abdominal cramping or pain.  You have rapid weight gain or loss.  You have shortness of breath with chest pain.  You notice sudden or extreme swelling of your face, hands, ankles, feet, or legs.  You have not felt your baby move in over an hour.  You have severe headaches that do not go away with medicine.  You have vision changes. Document Released: 04/13/2001 Document Revised: 12/20/2012 Document Reviewed: 06/20/2012 [employer] Patient Information 2014 [employer].  

## 2013-10-12 NOTE — MAU Note (Signed)
Pt denies pain or bleeding.

## 2013-11-12 ENCOUNTER — Inpatient Hospital Stay (HOSPITAL_COMMUNITY)
Admission: AD | Admit: 2013-11-12 | Discharge: 2013-11-12 | Disposition: A | Discharge status: Home / Self Care | Payer: Medicaid Other | Source: Ambulatory Visit | Attending: Family Medicine | Admitting: Family Medicine

## 2013-11-12 ENCOUNTER — Encounter (HOSPITAL_COMMUNITY): Payer: Self-pay | Admitting: *Deleted

## 2013-11-12 DIAGNOSIS — IMO0002 Reserved for concepts with insufficient information to code with codable children: Secondary | ICD-10-CM | POA: Insufficient documentation

## 2013-11-12 DIAGNOSIS — O99891 Other specified diseases and conditions complicating pregnancy: Secondary | ICD-10-CM | POA: Diagnosis not present

## 2013-11-12 DIAGNOSIS — Y92009 Unspecified place in unspecified non-institutional (private) residence as the place of occurrence of the external cause: Secondary | ICD-10-CM | POA: Diagnosis not present

## 2013-11-12 DIAGNOSIS — Z87891 Personal history of nicotine dependence: Secondary | ICD-10-CM | POA: Insufficient documentation

## 2013-11-12 DIAGNOSIS — A499 Bacterial infection, unspecified: Secondary | ICD-10-CM | POA: Diagnosis not present

## 2013-11-12 DIAGNOSIS — O2390 Unspecified genitourinary tract infection in pregnancy, unspecified trimester: Secondary | ICD-10-CM | POA: Diagnosis not present

## 2013-11-12 DIAGNOSIS — R109 Unspecified abdominal pain: Secondary | ICD-10-CM | POA: Diagnosis present

## 2013-11-12 DIAGNOSIS — O26899 Other specified pregnancy related conditions, unspecified trimester: Secondary | ICD-10-CM

## 2013-11-12 DIAGNOSIS — N760 Acute vaginitis: Secondary | ICD-10-CM | POA: Insufficient documentation

## 2013-11-12 DIAGNOSIS — B9689 Other specified bacterial agents as the cause of diseases classified elsewhere: Secondary | ICD-10-CM | POA: Diagnosis not present

## 2013-11-12 DIAGNOSIS — O9989 Other specified diseases and conditions complicating pregnancy, childbirth and the puerperium: Principal | ICD-10-CM

## 2013-11-12 DIAGNOSIS — O0990 Supervision of high risk pregnancy, unspecified, unspecified trimester: Secondary | ICD-10-CM

## 2013-11-12 DIAGNOSIS — Z8759 Personal history of other complications of pregnancy, childbirth and the puerperium: Secondary | ICD-10-CM

## 2013-11-12 LAB — DIFFERENTIAL
Basophils Absolute: 0 K/uL (ref 0.0–0.1)
Basophils Relative: 0 % (ref 0–1)
Eosinophils Absolute: 0.1 K/uL (ref 0.0–0.7)
Eosinophils Relative: 1 % (ref 0–5)
Lymphocytes Relative: 32 % (ref 12–46)
Lymphs Abs: 2.5 K/uL (ref 0.7–4.0)
Monocytes Absolute: 0.6 K/uL (ref 0.1–1.0)
Monocytes Relative: 8 % (ref 3–12)
Neutro Abs: 4.5 K/uL (ref 1.7–7.7)
Neutrophils Relative %: 59 % (ref 43–77)

## 2013-11-12 LAB — URINE MICROSCOPIC-ADD ON

## 2013-11-12 LAB — OB RESULTS CONSOLE GBS: STREP GROUP B AG: NEGATIVE

## 2013-11-12 LAB — TYPE AND SCREEN
ABO/RH(D): B POS
ANTIBODY SCREEN: NEGATIVE

## 2013-11-12 LAB — CBC
HCT: 29.7 % — ABNORMAL LOW (ref 36.0–46.0)
HEMOGLOBIN: 10.2 g/dL — AB (ref 12.0–15.0)
MCH: 30.4 pg (ref 26.0–34.0)
MCHC: 34.3 g/dL (ref 30.0–36.0)
MCV: 88.4 fL (ref 78.0–100.0)
Platelets: 275 10*3/uL (ref 150–400)
RBC: 3.36 MIL/uL — AB (ref 3.87–5.11)
RDW: 13 % (ref 11.5–15.5)
WBC: 7.8 10*3/uL (ref 4.0–10.5)

## 2013-11-12 LAB — RAPID HIV SCREEN (WH-MAU): Rapid HIV Screen: NONREACTIVE

## 2013-11-12 LAB — URINALYSIS, ROUTINE W REFLEX MICROSCOPIC
Bilirubin Urine: NEGATIVE
Glucose, UA: NEGATIVE mg/dL
Hgb urine dipstick: NEGATIVE
Ketones, ur: NEGATIVE mg/dL
Nitrite: NEGATIVE
Protein, ur: NEGATIVE mg/dL
Specific Gravity, Urine: 1.02 (ref 1.005–1.030)
Urobilinogen, UA: 0.2 mg/dL (ref 0.0–1.0)
pH: 7 (ref 5.0–8.0)

## 2013-11-12 LAB — HEPATITIS B SURFACE ANTIGEN: Hepatitis B Surface Ag: NEGATIVE

## 2013-11-12 LAB — WET PREP, GENITAL
Trich, Wet Prep: NONE SEEN
Yeast Wet Prep HPF POC: NONE SEEN

## 2013-11-12 LAB — OB RESULTS CONSOLE HIV ANTIBODY (ROUTINE TESTING): HIV: NONREACTIVE

## 2013-11-12 LAB — OB RESULTS CONSOLE GC/CHLAMYDIA
CHLAMYDIA, DNA PROBE: NEGATIVE
Gonorrhea: NEGATIVE

## 2013-11-12 LAB — RPR

## 2013-11-12 MED ORDER — GNP PRENATAL VITAMINS 28-0.8 MG PO TABS: 1.0000 | ORAL_TABLET | Freq: Every day | ORAL | Status: DC

## 2013-11-12 MED ORDER — METRONIDAZOLE 500 MG PO TABS: 500.0000 mg | ORAL_TABLET | Freq: Two times a day (BID) | ORAL | Status: DC

## 2013-11-12 MED ORDER — ASPIRIN 81 MG PO CHEW: 81.0000 mg | CHEWABLE_TABLET | Freq: Every day | ORAL | Status: DC
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
## 2013-11-12 NOTE — MAU Provider Note (Signed)
Attestation of Attending Supervision of Advanced Practitioner (PA/CNM/NP): Evaluation and management procedures were performed by the Advanced Practitioner under my supervision and collaboration.  I have reviewed the Advanced Practitioner's note and chart, and I agree with the management and plan.  Naylea Wigington S, MD [facility] Faculty Practice Attending 11/12/2013 3:32 PM   

## 2013-11-12 NOTE — Discharge Instructions (Signed)
Second Trimester of Pregnancy The second trimester is from week 13 through week 28, months 4 through 6. The second trimester is often a time when you feel your best. Your body has also adjusted to being pregnant, and you begin to feel better physically. Usually, morning sickness has lessened or quit completely, you may have more energy, and you may have an increase in appetite. The second trimester is also a time when the fetus is growing rapidly. At the end of the sixth month, the fetus is about 9 inches long and weighs about 1 pounds. You will likely begin to feel the baby move (quickening) between 18 and 20 weeks of the pregnancy. BODY CHANGES Your body goes through many changes during pregnancy. The changes vary from woman to woman.   Your weight will continue to increase. You will notice your lower abdomen bulging out.  You may begin to get stretch marks on your hips, abdomen, and breasts.  You may develop headaches that can be relieved by medicines approved by your health care provider.  You may urinate more often because the fetus is pressing on your bladder.  You may develop or continue to have heartburn as a result of your pregnancy.  You may develop constipation because certain hormones are causing the muscles that push waste through your intestines to slow down.  You may develop hemorrhoids or swollen, bulging veins (varicose veins).  You may have back pain because of the weight gain and pregnancy hormones relaxing your joints between the bones in your pelvis and as a result of a shift in weight and the muscles that support your balance.  Your breasts will continue to grow and be tender.  Your gums may bleed and may be sensitive to brushing and flossing.  Dark spots or blotches (chloasma, mask of pregnancy) may develop on your face. This will likely fade after the baby is born.  A dark line from your belly button to the pubic area (linea nigra) may appear. This will likely fade  after the baby is born.  You may have changes in your hair. These can include thickening of your hair, rapid growth, and changes in texture. Some women also have hair loss during or after pregnancy, or hair that feels dry or thin. Your hair will most likely return to normal after your baby is born. WHAT TO EXPECT AT YOUR PRENATAL VISITS During a routine prenatal visit:  You will be weighed to make sure you and the fetus are growing normally.  Your blood pressure will be taken.  Your abdomen will be measured to track your baby's growth.  The fetal heartbeat will be listened to.  Any test results from the previous visit will be discussed. Your health care provider may ask you:  How you are feeling.  If you are feeling the baby move.  If you have had any abnormal symptoms, such as leaking fluid, bleeding, severe headaches, or abdominal cramping.  If you have any questions. Other tests that may be performed during your second trimester include:  Blood tests that check for:  Low iron levels (anemia).  Gestational diabetes (between 24 and 28 weeks).  Rh antibodies.  Urine tests to check for infections, diabetes, or protein in the urine.  An ultrasound to confirm the proper growth and development of the baby.  An amniocentesis to check for possible genetic problems.  Fetal screens for spina bifida and Down syndrome. HOME CARE INSTRUCTIONS   Avoid all smoking, herbs, alcohol, and unprescribed   drugs. These chemicals affect the formation and growth of the baby.  Follow your health care provider's instructions regarding medicine use. There are medicines that are either safe or unsafe to take during pregnancy.  Exercise only as directed by your health care provider. Experiencing uterine cramps is a good sign to stop exercising.  Continue to eat regular, healthy meals.  Wear a good support bra for breast tenderness.  Do not use hot tubs, steam rooms, or saunas.  Wear your  seat belt at all times when driving.  Avoid raw meat, uncooked cheese, cat litter boxes, and soil used by cats. These carry germs that can cause birth defects in the baby.  Take your prenatal vitamins.  Try taking a stool softener (if your health care provider approves) if you develop constipation. Eat more high-fiber foods, such as fresh vegetables or fruit and whole grains. Drink plenty of fluids to keep your urine clear or pale yellow.  Take warm sitz baths to soothe any pain or discomfort caused by hemorrhoids. Use hemorrhoid cream if your health care provider approves.  If you develop varicose veins, wear support hose. Elevate your feet for 15 minutes, 3-4 times a day. Limit salt in your diet.  Avoid heavy lifting, wear low heel shoes, and practice good posture.  Rest with your legs elevated if you have leg cramps or low back pain.  Visit your dentist if you have not gone yet during your pregnancy. Use a soft toothbrush to brush your teeth and be gentle when you floss.  A sexual relationship may be continued unless your health care provider directs you otherwise.  Continue to go to all your prenatal visits as directed by your health care provider. SEEK MEDICAL CARE IF:   You have dizziness.  You have mild pelvic cramps, pelvic pressure, or nagging pain in the abdominal area.  You have persistent nausea, vomiting, or diarrhea.  You have a bad smelling vaginal discharge.  You have pain with urination. SEEK IMMEDIATE MEDICAL CARE IF:   You have a fever.  You are leaking fluid from your vagina.  You have spotting or bleeding from your vagina.  You have severe abdominal cramping or pain.  You have rapid weight gain or loss.  You have shortness of breath with chest pain.  You notice sudden or extreme swelling of your face, hands, ankles, feet, or legs.  You have not felt your baby move in over an hour.  You have severe headaches that do not go away with  medicine.  You have vision changes. Document Released: 04/13/2001 Document Revised: 04/24/2013 Document Reviewed: 06/20/2012 Exit[employer] Patient Information 2015 [employer]. This information is not intended to replace advice given to you by your health care provider. Make sure you discuss any questions you have with your health care provider.  
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
## 2013-11-13 LAB — GC/CHLAMYDIA PROBE AMP
CT Probe RNA: NEGATIVE
GC PROBE AMP APTIMA: NEGATIVE

## 2013-11-13 LAB — CULTURE, OB URINE
COLONY COUNT: NO GROWTH
Culture: NO GROWTH

## 2013-11-13 LAB — RUBELLA SCREEN: RUBELLA: 1.38 {index} — AB (ref <0.90)

## 2013-11-14 ENCOUNTER — Ambulatory Visit (HOSPITAL_COMMUNITY)
Admission: RE | Admit: 2013-11-14 | Discharge: 2013-11-14 | Disposition: A | Discharge status: Home / Self Care | Payer: Medicaid Other | Source: Ambulatory Visit | Attending: Family Medicine | Admitting: Family Medicine

## 2013-11-14 DIAGNOSIS — R109 Unspecified abdominal pain: Secondary | ICD-10-CM | POA: Diagnosis not present

## 2013-11-14 DIAGNOSIS — Z3689 Encounter for other specified antenatal screening: Secondary | ICD-10-CM | POA: Insufficient documentation

## 2013-11-14 DIAGNOSIS — O9989 Other specified diseases and conditions complicating pregnancy, childbirth and the puerperium: Principal | ICD-10-CM

## 2013-11-14 DIAGNOSIS — O99891 Other specified diseases and conditions complicating pregnancy: Secondary | ICD-10-CM | POA: Diagnosis not present

## 2013-11-14 DIAGNOSIS — O26899 Other specified pregnancy related conditions, unspecified trimester: Secondary | ICD-10-CM

## 2013-12-10 ENCOUNTER — Encounter: Payer: Self-pay | Admitting: Advanced Practice Midwife

## 2013-12-10 ENCOUNTER — Other Ambulatory Visit (HOSPITAL_COMMUNITY)
Admission: RE | Admit: 2013-12-10 | Discharge: 2013-12-10 | Disposition: A | Discharge status: Home / Self Care | Payer: Medicaid Other | Source: Ambulatory Visit | Attending: Obstetrics & Gynecology | Admitting: Obstetrics & Gynecology

## 2013-12-10 ENCOUNTER — Ambulatory Visit (INDEPENDENT_AMBULATORY_CARE_PROVIDER_SITE_OTHER): Payer: Medicaid Other | Admitting: Advanced Practice Midwife

## 2013-12-10 VITALS — BP 142/81 | HR 70 | Temp 98.5°F | Wt 205.0 lb

## 2013-12-10 DIAGNOSIS — I10 Essential (primary) hypertension: Secondary | ICD-10-CM | POA: Insufficient documentation

## 2013-12-10 DIAGNOSIS — Z113 Encounter for screening for infections with a predominantly sexual mode of transmission: Secondary | ICD-10-CM | POA: Diagnosis present

## 2013-12-10 DIAGNOSIS — O132 Gestational [pregnancy-induced] hypertension without significant proteinuria, second trimester: Secondary | ICD-10-CM | POA: Insufficient documentation

## 2013-12-10 DIAGNOSIS — O10912 Unspecified pre-existing hypertension complicating pregnancy, second trimester: Secondary | ICD-10-CM

## 2013-12-10 DIAGNOSIS — O139 Gestational [pregnancy-induced] hypertension without significant proteinuria, unspecified trimester: Secondary | ICD-10-CM

## 2013-12-10 DIAGNOSIS — Z01419 Encounter for gynecological examination (general) (routine) without abnormal findings: Secondary | ICD-10-CM | POA: Insufficient documentation

## 2013-12-10 DIAGNOSIS — O10019 Pre-existing essential hypertension complicating pregnancy, unspecified trimester: Secondary | ICD-10-CM

## 2013-12-10 LAB — POCT URINALYSIS DIP (DEVICE)
Glucose, UA: NEGATIVE mg/dL
Hgb urine dipstick: NEGATIVE
KETONES UR: NEGATIVE mg/dL
Nitrite: NEGATIVE
Protein, ur: NEGATIVE mg/dL
Specific Gravity, Urine: 1.02 (ref 1.005–1.030)
Urobilinogen, UA: 0.2 mg/dL (ref 0.0–1.0)
pH: 6 (ref 5.0–8.0)

## 2013-12-10 NOTE — Progress Notes (Signed)
   Subjective:    Tara Parrish is a [id number] 3522w4d being seen today for her first obstetrical visit.  Her obstetrical history is significant for NSVD x 2 with IOL for preeclampsia in first pregnancy, SOL but treatement for gestational HTN in second. Patient does intend to breast feed. Pregnancy history fully reviewed.  Patient reports no complaints.  Filed Vitals:   12/10/13 1435 12/10/13 1446  BP: 135/106 142/81  Pulse: 70   Temp: 98.5 F (36.9 C)   Weight: 205 lb (92.987 kg)     HISTORY: OB History  Gravida Para Term Preterm AB SAB TAB Ectopic Multiple Living  3 2 2  0 0 0 0 0 0 2    # Outcome Date GA Lbr Len/2nd Weight Sex Delivery Anes PTL Lv  3 CUR           2 TRM 01/22/11 1473w5d 09:27 / 00:03 6 lb 13.2 oz (3.095 kg) M SVD EPI  Y     Comments: None  1 TRM 12/15/08 6483w0d   M SVD EPI N Y     Comments: induced for gestational HTN     Past Medical History  Diagnosis Date  . Pregnancy induced hypertension   . Chlamydia 11/2010    treated   Past Surgical History  Procedure Laterality Date  . No past surgeries     Family History  Problem Relation Age of Onset  . Kidney Stones Mother      Exam    Uterus:   27 cm fundal height  Pelvic Exam:    Perineum: No Hemorrhoids, Normal Perineum   Vulva: normal   Vagina:  normal mucosa, normal discharge   pH:    Cervix: multiparous appearance and no lesions   Adnexa: not palpable due to advanced gestation   Bony Pelvis: average   Respiratory:   acyanotic, in no respiratory distress   Abdomen: soft, non-tender; bowel sounds normal; no masses,  no organomegaly   Urinary: urethral meatus normal      Assessment:    Pregnancy: [id number] Patient Active Problem List   Diagnosis Date Noted  . Gestational hypertension w/o significant proteinuria in 2nd trimester 12/10/2013  . History of gestational hypertension 11/12/2013  . Supervision of high-risk pregnancy 11/12/2013        Plan:     Initial labs drawn. Prenatal  vitamins. Problem list reviewed and updated. Genetic Screening discussed : late to care.  Ultrasound discussed; fetal survey: results reviewed and EDD changed.  Repeat U/S recommended at 28-29 weeks. .  Follow up in 2 weeks. 50% of 30 min visit spent on counseling and coordination of care.     LEFTWICH-KIRBY, Samanatha Brammer 12/10/2013

## 2013-12-10 NOTE — Progress Notes (Signed)
Here for Initial visit.  States has not gotten prescription vitamins or flagyl. Given new patient information.   Discussed BMI/ appropriate weight gain.

## 2013-12-11 ENCOUNTER — Telehealth: Payer: Self-pay

## 2013-12-11 NOTE — Telephone Encounter (Signed)
Message copied by Louanna RawAMPBELL, Foy Mungia M on Tue Dec 11, 2013  7:57 AM ------      Message from: LEFTWICH-KIRBY, LISA A      Created: Mon Dec 10, 2013  4:55 PM      Regarding: OB pt needs f/u ultrasound      Contact: [phone]       I saw this pt for her initial OB visit at 27 weeks. She needs f/u OB ultrasound scheduled ~2 weeks from now to complete anatomy and confirm her dating (last ultrasound changed EDD by 4 weeks). Thank you.   ------

## 2013-12-11 NOTE — Telephone Encounter (Signed)
Ultrasound scheduled for 345 on Monday 8/24. Called patient-- woman stated patient was not available but would take a message. Left message informing her of appointment date, time and location and number to radiology if she needs to reschedule. Advised that patient arrive 15 minutes early.

## 2013-12-12 LAB — CYTOLOGY - PAP

## 2013-12-24 ENCOUNTER — Ambulatory Visit (HOSPITAL_COMMUNITY)
Admission: RE | Admit: 2013-12-24 | Discharge: 2013-12-24 | Disposition: A | Discharge status: Home / Self Care | Payer: Medicaid Other | Source: Ambulatory Visit | Attending: Advanced Practice Midwife | Admitting: Advanced Practice Midwife

## 2013-12-24 DIAGNOSIS — O10912 Unspecified pre-existing hypertension complicating pregnancy, second trimester: Secondary | ICD-10-CM

## 2013-12-24 DIAGNOSIS — Z3689 Encounter for other specified antenatal screening: Secondary | ICD-10-CM | POA: Diagnosis not present

## 2013-12-24 DIAGNOSIS — O10019 Pre-existing essential hypertension complicating pregnancy, unspecified trimester: Secondary | ICD-10-CM | POA: Diagnosis present

## 2013-12-24 DIAGNOSIS — O358XX0 Maternal care for other (suspected) fetal abnormality and damage, not applicable or unspecified: Secondary | ICD-10-CM

## 2013-12-27 ENCOUNTER — Encounter: Payer: Medicaid Other | Admitting: Advanced Practice Midwife

## 2013-12-31 ENCOUNTER — Ambulatory Visit (INDEPENDENT_AMBULATORY_CARE_PROVIDER_SITE_OTHER): Payer: Medicaid Other | Admitting: Obstetrics & Gynecology

## 2013-12-31 VITALS — BP 138/93 | HR 69 | Wt 207.2 lb

## 2013-12-31 DIAGNOSIS — O133 Gestational [pregnancy-induced] hypertension without significant proteinuria, third trimester: Secondary | ICD-10-CM

## 2013-12-31 DIAGNOSIS — O139 Gestational [pregnancy-induced] hypertension without significant proteinuria, unspecified trimester: Secondary | ICD-10-CM

## 2013-12-31 DIAGNOSIS — Z23 Encounter for immunization: Secondary | ICD-10-CM

## 2013-12-31 DIAGNOSIS — O0990 Supervision of high risk pregnancy, unspecified, unspecified trimester: Secondary | ICD-10-CM

## 2013-12-31 DIAGNOSIS — O0993 Supervision of high risk pregnancy, unspecified, third trimester: Secondary | ICD-10-CM

## 2013-12-31 LAB — CBC
HEMATOCRIT: 30.5 % — AB (ref 36.0–46.0)
HEMOGLOBIN: 10.7 g/dL — AB (ref 12.0–15.0)
MCH: 30.1 pg (ref 26.0–34.0)
MCHC: 35.1 g/dL (ref 30.0–36.0)
MCV: 85.7 fL (ref 78.0–100.0)
Platelets: 294 10*3/uL (ref 150–400)
RBC: 3.56 MIL/uL — ABNORMAL LOW (ref 3.87–5.11)
RDW: 12.4 % (ref 11.5–15.5)
WBC: 7.6 10*3/uL (ref 4.0–10.5)

## 2013-12-31 LAB — POCT URINALYSIS DIP (DEVICE)
Bilirubin Urine: NEGATIVE
Glucose, UA: NEGATIVE mg/dL
Hgb urine dipstick: NEGATIVE
Ketones, ur: NEGATIVE mg/dL
Nitrite: NEGATIVE
PROTEIN: NEGATIVE mg/dL
SPECIFIC GRAVITY, URINE: 1.015 (ref 1.005–1.030)
Urobilinogen, UA: 0.2 mg/dL (ref 0.0–1.0)
pH: 7 (ref 5.0–8.0)

## 2013-12-31 MED ORDER — TETANUS-DIPHTH-ACELL PERTUSSIS 5-2.5-18.5 LF-MCG/0.5 IM SUSP: 0.5000 mL | Freq: Once | INTRAMUSCULAR | Status: DC

## 2013-12-31 NOTE — Progress Notes (Signed)
Patient reports pelvic pressure.

## 2013-12-31 NOTE — Progress Notes (Signed)
BP high today.  ?CHTN.  Will get labs, protein/creat ratio and do 24 hour urine   [phone] (grandmother).   Pt can't come for BP check tomorrow but will check at [facility] tomorrow and call at 11 am. No headache, scotomata, RUQ pain. Considering BTL but also thinking about Nexplanon.  Will discuss at each visit.

## 2013-12-31 NOTE — Patient Instructions (Signed)
Levonorgestrel intrauterine device (IUD) What is this medicine? LEVONORGESTREL IUD (LEE voe nor jes trel) is a contraceptive (birth control) device. The device is placed inside the uterus by a healthcare professional. It is used to prevent pregnancy and can also be used to treat heavy bleeding that occurs during your period. Depending on the device, it can be used for 3 to 5 years. This medicine may be used for other purposes; ask your health care provider or pharmacist if you have questions. COMMON BRAND NAME(S): LILETTA, Mirena, Skyla What should I tell my health care provider before I take this medicine? They need to know if you have any of these conditions: -abnormal Pap smear -cancer of the breast, uterus, or cervix -diabetes -endometritis -genital or pelvic infection now or in the past -have more than one sexual partner or your partner has more than one partner -heart disease -history of an ectopic or tubal pregnancy -immune system problems -IUD in place -liver disease or tumor -problems with blood clots or take blood-thinners -use intravenous drugs -uterus of unusual shape -vaginal bleeding that has not been explained -an unusual or allergic reaction to levonorgestrel, other hormones, silicone, or polyethylene, medicines, foods, dyes, or preservatives -pregnant or trying to get pregnant -breast-feeding How should I use this medicine? This device is placed inside the uterus by a health care professional. Talk to your pediatrician regarding the use of this medicine in children. Special care may be needed. Overdosage: If you think you have taken too much of this medicine contact a poison control center or emergency room at once. NOTE: This medicine is only for you. Do not share this medicine with others. What if I miss a dose? This does not apply. What may interact with this medicine? Do not take this medicine with any of the following  medications: -amprenavir -bosentan -fosamprenavir This medicine may also interact with the following medications: -aprepitant -barbiturate medicines for inducing sleep or treating seizures -bexarotene -griseofulvin -medicines to treat seizures like carbamazepine, ethotoin, felbamate, oxcarbazepine, phenytoin, topiramate -modafinil -pioglitazone -rifabutin -rifampin -rifapentine -some medicines to treat HIV infection like atazanavir, indinavir, lopinavir, nelfinavir, tipranavir, ritonavir -St. John's wort -warfarin This list may not describe all possible interactions. Give your health care provider a list of all the medicines, herbs, non-prescription drugs, or dietary supplements you use. Also tell them if you smoke, drink alcohol, or use illegal drugs. Some items may interact with your medicine. What should I watch for while using this medicine? Visit your doctor or health care professional for regular check ups. See your doctor if you or your partner has sexual contact with others, becomes HIV positive, or gets a sexual transmitted disease. This product does not protect you against HIV infection (AIDS) or other sexually transmitted diseases. You can check the placement of the IUD yourself by reaching up to the top of your vagina with clean fingers to feel the threads. Do not pull on the threads. It is a good habit to check placement after each menstrual period. Call your doctor right away if you feel more of the IUD than just the threads or if you cannot feel the threads at all. The IUD may come out by itself. You may become pregnant if the device comes out. If you notice that the IUD has come out use a backup birth control method like condoms and call your health care provider. Using tampons will not change the position of the IUD and are okay to use during your period. What side effects may   I notice from receiving this medicine? Side effects that you should report to your doctor or  health care professional as soon as possible: -allergic reactions like skin rash, itching or hives, swelling of the face, lips, or tongue -fever, flu-like symptoms -genital sores -high blood pressure -no menstrual period for 6 weeks during use -pain, swelling, warmth in the leg -pelvic pain or tenderness -severe or sudden headache -signs of pregnancy -stomach cramping -sudden shortness of breath -trouble with balance, talking, or walking -unusual vaginal bleeding, discharge -yellowing of the eyes or skin Side effects that usually do not require medical attention (report to your doctor or health care professional if they continue or are bothersome): -acne -breast pain -change in sex drive or performance -changes in weight -cramping, dizziness, or faintness while the device is being inserted -headache -irregular menstrual bleeding within first 3 to 6 months of use -nausea This list may not describe all possible side effects. Call your doctor for medical advice about side effects. You may report side effects to FDA at 1-800-FDA-1088. Where should I keep my medicine? This does not apply. NOTE: This sheet is a summary. It may not cover all possible information. If you have questions about this medicine, talk to your doctor, pharmacist, or health care provider.  2015, [employer]. (2011-05-20 13:54:04) Etonogestrel implant What is this medicine? ETONOGESTREL (et oh noe JES trel) is a contraceptive (birth control) device. It is used to prevent pregnancy. It can be used for up to 3 years. This medicine may be used for other purposes; ask your health care provider or pharmacist if you have questions. COMMON BRAND NAME(S): Implanon, Nexplanon What should I tell my health care provider before I take this medicine? They need to know if you have any of these conditions: -abnormal vaginal bleeding -blood vessel disease or blood clots -cancer of the breast, cervix, or  liver -depression -diabetes -gallbladder disease -headaches -heart disease or recent heart attack -high blood pressure -high cholesterol -kidney disease -liver disease -renal disease -seizures -tobacco smoker -an unusual or allergic reaction to etonogestrel, other hormones, anesthetics or antiseptics, medicines, foods, dyes, or preservatives -pregnant or trying to get pregnant -breast-feeding How should I use this medicine? This device is inserted just under the skin on the inner side of your upper arm by a health care professional. Talk to your pediatrician regarding the use of this medicine in children. Special care may be needed. Overdosage: If you think you've taken too much of this medicine contact a poison control center or emergency room at once. Overdosage: If you think you have taken too much of this medicine contact a poison control center or emergency room at once. NOTE: This medicine is only for you. Do not share this medicine with others. What if I miss a dose? This does not apply. What may interact with this medicine? Do not take this medicine with any of the following medications: -amprenavir -bosentan -fosamprenavir This medicine may also interact with the following medications: -barbiturate medicines for inducing sleep or treating seizures -certain medicines for fungal infections like ketoconazole and itraconazole -griseofulvin -medicines to treat seizures like carbamazepine, felbamate, oxcarbazepine, phenytoin, topiramate -modafinil -phenylbutazone -rifampin -some medicines to treat HIV infection like atazanavir, indinavir, lopinavir, nelfinavir, tipranavir, ritonavir -St. John's wort This list may not describe all possible interactions. Give your health care provider a list of all the medicines, herbs, non-prescription drugs, or dietary supplements you use. Also tell them if you smoke, drink alcohol, or use illegal drugs. Some items may  interact with your  medicine. What should I watch for while using this medicine? This product does not protect you against HIV infection (AIDS) or other sexually transmitted diseases. You should be able to feel the implant by pressing your fingertips over the skin where it was inserted. Tell your doctor if you cannot feel the implant. What side effects may I notice from receiving this medicine? Side effects that you should report to your doctor or health care professional as soon as possible: -allergic reactions like skin rash, itching or hives, swelling of the face, lips, or tongue -breast lumps -changes in vision -confusion, trouble speaking or understanding -dark urine -depressed mood -general ill feeling or flu-like symptoms -light-colored stools -loss of appetite, nausea -right upper belly pain -severe headaches -severe pain, swelling, or tenderness in the abdomen -shortness of breath, chest pain, swelling in a leg -signs of pregnancy -sudden numbness or weakness of the face, arm or leg -trouble walking, dizziness, loss of balance or coordination -unusual vaginal bleeding, discharge -unusually weak or tired -yellowing of the eyes or skin Side effects that usually do not require medical attention (Report these to your doctor or health care professional if they continue or are bothersome.): -acne -breast pain -changes in weight -cough -fever or chills -headache -irregular menstrual bleeding -itching, burning, and vaginal discharge -pain or difficulty passing urine -sore throat This list may not describe all possible side effects. Call your doctor for medical advice about side effects. You may report side effects to FDA at 1-800-FDA-1088. Where should I keep my medicine? This drug is given in a hospital or clinic and will not be stored at home. NOTE: This sheet is a summary. It may not cover all possible information. If you have questions about this medicine, talk to your doctor, pharmacist, or  health care provider.  2015, [employer]. (2011-10-25 15:37:45)

## 2014-01-01 ENCOUNTER — Inpatient Hospital Stay (HOSPITAL_COMMUNITY)
Admission: AD | Admit: 2014-01-01 | Discharge: 2014-01-01 | Disposition: A | Discharge status: Home / Self Care | Payer: Medicaid Other | Source: Ambulatory Visit | Attending: Family Medicine | Admitting: Family Medicine

## 2014-01-01 ENCOUNTER — Encounter: Payer: Self-pay | Admitting: Obstetrics & Gynecology

## 2014-01-01 ENCOUNTER — Encounter (HOSPITAL_COMMUNITY): Payer: Self-pay | Admitting: *Deleted

## 2014-01-01 ENCOUNTER — Telehealth: Payer: Self-pay

## 2014-01-01 DIAGNOSIS — O139 Gestational [pregnancy-induced] hypertension without significant proteinuria, unspecified trimester: Secondary | ICD-10-CM

## 2014-01-01 DIAGNOSIS — O0993 Supervision of high risk pregnancy, unspecified, third trimester: Secondary | ICD-10-CM

## 2014-01-01 DIAGNOSIS — R030 Elevated blood-pressure reading, without diagnosis of hypertension: Secondary | ICD-10-CM | POA: Diagnosis present

## 2014-01-01 DIAGNOSIS — O10019 Pre-existing essential hypertension complicating pregnancy, unspecified trimester: Secondary | ICD-10-CM | POA: Insufficient documentation

## 2014-01-01 DIAGNOSIS — Z87891 Personal history of nicotine dependence: Secondary | ICD-10-CM | POA: Insufficient documentation

## 2014-01-01 DIAGNOSIS — Z8759 Personal history of other complications of pregnancy, childbirth and the puerperium: Secondary | ICD-10-CM

## 2014-01-01 LAB — COMPREHENSIVE METABOLIC PANEL
ALBUMIN: 2.9 g/dL — AB (ref 3.5–5.2)
ALT: 12 U/L (ref 0–35)
AST: 21 U/L (ref 0–37)
Alkaline Phosphatase: 115 U/L (ref 39–117)
Anion gap: 12 (ref 5–15)
BUN: 5 mg/dL — ABNORMAL LOW (ref 6–23)
CO2: 22 mEq/L (ref 19–32)
CREATININE: 0.43 mg/dL — AB (ref 0.50–1.10)
Calcium: 8.4 mg/dL (ref 8.4–10.5)
Chloride: 104 mEq/L (ref 96–112)
GFR calc Af Amer: >90 mL/min (ref >90)
GFR calc non Af Amer: >90 mL/min (ref >90)
Glucose, Bld: 98 mg/dL (ref 70–99)
Potassium: 3.1 mEq/L — ABNORMAL LOW (ref 3.7–5.3)
Sodium: 138 mEq/L (ref 137–147)
Total Protein: 6.5 g/dL (ref 6.0–8.3)

## 2014-01-01 LAB — CBC
HCT: 29.9 % — ABNORMAL LOW (ref 36.0–46.0)
Hemoglobin: 10.4 g/dL — ABNORMAL LOW (ref 12.0–15.0)
MCH: 30.2 pg (ref 26.0–34.0)
MCHC: 34.8 g/dL (ref 30.0–36.0)
MCV: 86.9 fL (ref 78.0–100.0)
PLATELETS: 255 10*3/uL (ref 150–400)
RBC: 3.44 MIL/uL — ABNORMAL LOW (ref 3.87–5.11)
RDW: 12.6 % (ref 11.5–15.5)
WBC: 8.2 10*3/uL (ref 4.0–10.5)

## 2014-01-01 LAB — PROTEIN / CREATININE RATIO, URINE
CREATININE, URINE: 130.9 mg/dL
Creatinine, Urine: 244.8 mg/dL
PROTEIN CREATININE RATIO: 0.09 (ref <0.15)
Protein Creatinine Ratio: 0.09 (ref 0.00–0.15)
TOTAL PROTEIN, URINE: 22 mg/dL (ref 5–24)
TOTAL PROTEIN, URINE: 11.4 mg/dL

## 2014-01-01 LAB — RPR

## 2014-01-01 LAB — HIV ANTIBODY (ROUTINE TESTING W REFLEX): HIV: NONREACTIVE

## 2014-01-01 LAB — GLUCOSE TOLERANCE, 1 HOUR (50G) W/O FASTING: GLUCOSE 1 HOUR GTT: 77 mg/dL (ref 70–140)

## 2014-01-01 LAB — LACTATE DEHYDROGENASE: LDH: 162 U/L (ref 94–250)

## 2014-01-01 MED ORDER — LABETALOL HCL 100 MG PO TABS: 100.0000 mg | ORAL_TABLET | Freq: Three times a day (TID) | ORAL | Status: DC

## 2014-01-01 MED ORDER — LABETALOL HCL 5 MG/ML IV SOLN
10.0000 mg | INTRAVENOUS | Status: DC | PRN
  Administered 2014-01-01: 10 mg via INTRAVENOUS
  Filled 2014-01-01: qty 4

## 2014-01-01 NOTE — MAU Note (Signed)
BP has  Been up, was told to come here for further eval.  No headache, visual changes or epigastric pain, denies increase in swelling. Had reported some dizziness when first woke up, took a minute before getting out of bed, feeling passed, no further issue today.

## 2014-01-01 NOTE — MAU Note (Signed)
Urine in lab 

## 2014-01-01 NOTE — Discharge Instructions (Signed)

## 2014-01-01 NOTE — Telephone Encounter (Signed)
Patient called with BP check [facility]-- patient reports BP being 167/105. Reports being "a little dizzy" this morning upon waking but sat on side of bed and felt fine after wards. Denies headache, visual changes or abdominal pain. Consulted with Wynelle Bourgeois, CNM who states patient should come to MAU. Called patient at work [phone] and informed her of this. Patient states her manager will have to find someone to cover so she will come to hospital but it may be a while. Asked patient if we could trust she could get here at some point today. Patient stated that she would. No further questions or concerns.

## 2014-01-01 NOTE — MAU Provider Note (Signed)
History     CSN: [id number]  Arrival date and time: 01/01/14 1338   First Provider Initiated Contact with Patient 01/01/14 1501      Chief Complaint  Patient presents with  . Hypertension   HPI   This is a G3P2 at GA 30.6 by LMP c/w. She presents to L&D for evaluation of elevated blood pressures. She checked her BP as instructed by her primary obstetrician prior to work and noted that it was 160s/100s. She has had no symptosm of headache, blurred vision, RUQ abdominal pain, nausea/vomiting, or edema. Reports good fetal movement. No contractions, vaginal bleeding, or leakage of fluid. She has had chronic hypertension during this pregnancy and is not currently on any medications for BP management but is on aspirin 81 mg daily and reports compliance with this.   Past Medical History  Diagnosis Date  . Pregnancy induced hypertension   . Chlamydia 11/2010    treated    Past Surgical History  Procedure Laterality Date  . No past surgeries      Family History  Problem Relation Age of Onset  . Kidney Stones Mother   . Hypertension Maternal Grandmother     History  Substance Use Topics  . Smoking status: Former Smoker -- 0.25 packs/day for 1 years    Quit date: 09/06/2010  . Smokeless tobacco: Never Used  . Alcohol Use: No    Allergies:  Allergies  Allergen Reactions  . Bee Venom Hives and Itching    Facility-administered medications prior to admission  Medication Dose Route Frequency Provider Last Rate Last Dose  . Tdap (BOOSTRIX) injection 0.5 mL  0.5 mL Intramuscular Once Lesly Dukes, MD       Prescriptions prior to admission  Medication Sig Dispense Refill  . aspirin 81 MG chewable tablet Chew 162-324 mg by mouth daily as needed for headache.      . Prenatal Vit-Fe Fumarate-FA (PRENATAL MULTIVITAMIN) TABS tablet Take 1 tablet by mouth daily at 12 noon.      . metroNIDAZOLE (FLAGYL) 500 MG tablet Take 1 tablet (500 mg total) by mouth 2 (two) times daily.  14  tablet  0    Review of Systems  Constitutional: Negative for fever, chills and weight loss.  Respiratory: Negative for cough and shortness of breath.   Cardiovascular: Negative for chest pain and palpitations.  Gastrointestinal: Negative for heartburn, nausea, vomiting and abdominal pain.  Genitourinary: Negative for dysuria and urgency.  Neurological: Negative for dizziness and tingling.   Physical Exam   Blood pressure 148/94, pulse 73, temperature 98.9 F (37.2 C), temperature source Oral, resp. rate 18, height  (1.626 m), weight 207 lb (93.895 kg), last menstrual period 06/27/2013, unknown if currently breastfeeding.  Physical Exam  Constitutional: She is oriented to person, place, and time. She appears well-developed and well-nourished.  HENT:  Head: Normocephalic and atraumatic.  Eyes: Conjunctivae and EOM are normal.  Neck: Normal range of motion.  Cardiovascular: Normal rate.   Respiratory: Effort normal. No respiratory distress.  GI: Soft. She exhibits no distension. There is no tenderness.  Musculoskeletal: Normal range of motion. She exhibits no edema.  Neurological: She is alert and oriented to person, place, and time. She has normal reflexes.  Skin: Skin is warm and dry. No erythema.    MAU Course  Procedures  NST reactive  Results for Tara, Parrish (MRN [id number]) as of 01/01/2014 20:57  Ref. Range 01/01/2014 15:08  Sodium Latest Range: 137-147 mEq/L 138  Potassium  Latest Range: 3.7-5.3 mEq/L 3.1 (L)  Chloride Latest Range: 96-112 mEq/L 104  CO2 Latest Range: 19-32 mEq/L 22  BUN Latest Range: 6-23 mg/dL 5 (L)  Creatinine Latest Range: 0.50-1.10 mg/dL 1.61 (L)  Calcium Latest Range: 8.4-10.5 mg/dL 8.4  GFR calc non Af Amer Latest Range: >90 mL/min >90  GFR calc Af Amer Latest Range: >90 mL/min >90  Glucose Latest Range: 70-99 mg/dL 98  Anion gap Latest Range: 5-15  12  Alkaline Phosphatase Latest Range: 39-117 U/L 115  Albumin Latest Range: 3.5-5.2  g/dL 2.9 (L)  AST Latest Range: 0-37 U/L 21  ALT Latest Range: 0-35 U/L 12  Total Protein Latest Range: 6.0-8.3 g/dL 6.5  Total Bilirubin Latest Range: 0.3-1.2 mg/dL <0.9 (L)  LDH Latest Range: 94-250 U/L 162  WBC Latest Range: 4.0-10.5 K/uL 8.2  RBC Latest Range: 3.87-5.11 MIL/uL 3.44 (L)  Hemoglobin Latest Range: 12.0-15.0 g/dL 60.4 (L)  HCT Latest Range: 36.0-46.0 % 29.9 (L)  MCV Latest Range: 78.0-100.0 fL 86.9  MCH Latest Range: 26.0-34.0 pg 30.2  MCHC Latest Range: 30.0-36.0 g/dL 54.0  RDW Latest Range: 11.5-15.5 % 12.6  Platelets Latest Range: 150-400 K/uL 255   Urine prot/cr ratio 0.07  Assessment and Plan    1. IUP at 30.6. Not in active labor. Labor precautions reviewed. 2. FWB. Category I strip. Fetal activity chart reviewed. 3. Chronic HTN: Pt with persistently elevated BPs in triage > 160s/ 100s. She was asymptomatic from this. Repeat labs including CBC, CMP, urine protein/creatinine ratio within normal limits. She was provided a 1 time dose of labetalol 10 mg IV X1. She will be discharged with 100 mg TID. She was instructed to complete 24 hour urine protein study and follow up in clinic in 3 days. Preeclampsia precautions reviewed.  Tara Parrish 01/01/2014, 4:36 PM

## 2014-01-02 NOTE — MAU Provider Note (Signed)
Attestation of Attending Supervision of Obstetric Fellow: Evaluation and management procedures were performed by the Obstetric Fellow under my supervision and collaboration.  I have reviewed the Obstetric Fellow's note and chart, and I agree with the management and plan.  Junette Bernat, DO Attending Physician Faculty Practice, [facility]  

## 2014-01-08 ENCOUNTER — Ambulatory Visit (INDEPENDENT_AMBULATORY_CARE_PROVIDER_SITE_OTHER): Payer: Medicaid Other | Admitting: Obstetrics and Gynecology

## 2014-01-08 ENCOUNTER — Encounter: Payer: Self-pay | Admitting: Obstetrics and Gynecology

## 2014-01-08 VITALS — BP 162/80 | HR 63 | Temp 98.4°F | Wt 204.5 lb

## 2014-01-08 DIAGNOSIS — O0993 Supervision of high risk pregnancy, unspecified, third trimester: Secondary | ICD-10-CM

## 2014-01-08 DIAGNOSIS — O10019 Pre-existing essential hypertension complicating pregnancy, unspecified trimester: Secondary | ICD-10-CM

## 2014-01-08 DIAGNOSIS — Z8742 Personal history of other diseases of the female genital tract: Secondary | ICD-10-CM

## 2014-01-08 DIAGNOSIS — I10 Essential (primary) hypertension: Secondary | ICD-10-CM

## 2014-01-08 DIAGNOSIS — Z8759 Personal history of other complications of pregnancy, childbirth and the puerperium: Secondary | ICD-10-CM

## 2014-01-08 DIAGNOSIS — O0990 Supervision of high risk pregnancy, unspecified, unspecified trimester: Secondary | ICD-10-CM

## 2014-01-08 DIAGNOSIS — O10913 Unspecified pre-existing hypertension complicating pregnancy, third trimester: Secondary | ICD-10-CM

## 2014-01-08 LAB — POCT URINALYSIS DIP (DEVICE)
Bilirubin Urine: NEGATIVE
GLUCOSE, UA: NEGATIVE mg/dL
Hgb urine dipstick: NEGATIVE
Ketones, ur: 40 mg/dL — AB
NITRITE: NEGATIVE
Protein, ur: NEGATIVE mg/dL
Specific Gravity, Urine: 1.01 (ref 1.005–1.030)
UROBILINOGEN UA: 4 mg/dL — AB (ref 0.0–1.0)
pH: 7 (ref 5.0–8.0)

## 2014-01-08 NOTE — Progress Notes (Signed)
Patient has not started taking labetalol-- waiting for medicaid card which she states she should receive this week.  Was unable to obtain 24hr urine yesterday as she had to work.  Reports occasional pelvic pressure.

## 2014-01-08 NOTE — Progress Notes (Signed)
Patient is doing well without complaints. She did not get a chance to complete her 24 hr urine secondary to her work hours. Advise patient to complete urine collection on Saturday and to bring it to MAU on Sunday. Patient states she lost her Medicaid card and is not able to pick up prescription for labetalol. Patient is expecting new card this week. Discussed risks of poorly controlled hypertension in pregnancy

## 2014-01-09 ENCOUNTER — Telehealth: Payer: Self-pay | Admitting: *Deleted

## 2014-01-09 NOTE — Telephone Encounter (Signed)
Patient called and asked if there was a difference between braxton hicks and contractions. Discussed with patient that they were different in that labor contractions can be 4 minutes apart that does not go away after an hour but continue and get closer together and that labor contractions would be painful and that she may feel her whole belly get hard and stay hard through the contraction but after the contraction it would go away and soften back. Told patient that with braxton hicks she may feel that same tightening sensation but should not really be painful and usually only happen a few times a day or go away after an hour. Patient verbalized understanding and states that it feels like contractions and that she has been having this since 2pm and that on a pain scale it's about a 7. Discussed with patient how to time contractions. Told patient to drink plenty of water over the next hour and time her contractions and that after an hour if nothing has changed and she continues to have pain to go to MAU for further evaluation of her and the baby. Patient verbalized understanding and had no other questions

## 2014-01-17 ENCOUNTER — Inpatient Hospital Stay (HOSPITAL_COMMUNITY)
Admission: AD | Admit: 2014-01-17 | Discharge: 2014-01-17 | Disposition: A | Discharge status: Home / Self Care | Payer: Medicaid Other | Source: Ambulatory Visit | Attending: Obstetrics & Gynecology | Admitting: Obstetrics & Gynecology

## 2014-01-17 ENCOUNTER — Ambulatory Visit (INDEPENDENT_AMBULATORY_CARE_PROVIDER_SITE_OTHER): Payer: Medicaid Other | Admitting: Family

## 2014-01-17 ENCOUNTER — Encounter (HOSPITAL_COMMUNITY): Payer: Self-pay | Admitting: *Deleted

## 2014-01-17 VITALS — BP 157/97 | HR 75 | Wt 200.0 lb

## 2014-01-17 DIAGNOSIS — Z8742 Personal history of other diseases of the female genital tract: Secondary | ICD-10-CM

## 2014-01-17 DIAGNOSIS — Z8759 Personal history of other complications of pregnancy, childbirth and the puerperium: Secondary | ICD-10-CM

## 2014-01-17 DIAGNOSIS — O0993 Supervision of high risk pregnancy, unspecified, third trimester: Secondary | ICD-10-CM

## 2014-01-17 DIAGNOSIS — O10019 Pre-existing essential hypertension complicating pregnancy, unspecified trimester: Secondary | ICD-10-CM

## 2014-01-17 DIAGNOSIS — O0990 Supervision of high risk pregnancy, unspecified, unspecified trimester: Secondary | ICD-10-CM

## 2014-01-17 DIAGNOSIS — O10913 Unspecified pre-existing hypertension complicating pregnancy, third trimester: Secondary | ICD-10-CM

## 2014-01-17 DIAGNOSIS — I10 Essential (primary) hypertension: Secondary | ICD-10-CM

## 2014-01-17 LAB — URINALYSIS, ROUTINE W REFLEX MICROSCOPIC
Glucose, UA: 100 mg/dL — AB
Hgb urine dipstick: NEGATIVE
Ketones, ur: 15 mg/dL — AB
Nitrite: NEGATIVE
PH: 6 (ref 5.0–8.0)
Protein, ur: NEGATIVE mg/dL
SPECIFIC GRAVITY, URINE: 1.025 (ref 1.005–1.030)
UROBILINOGEN UA: 2 mg/dL — AB (ref 0.0–1.0)

## 2014-01-17 LAB — COMPREHENSIVE METABOLIC PANEL
ALK PHOS: 149 U/L — AB (ref 39–117)
ALT: 7 U/L (ref 0–35)
AST: 14 U/L (ref 0–37)
Albumin: 3.2 g/dL — ABNORMAL LOW (ref 3.5–5.2)
Anion gap: 14 (ref 5–15)
BUN: 5 mg/dL — ABNORMAL LOW (ref 6–23)
CO2: 20 meq/L (ref 19–32)
Calcium: 9 mg/dL (ref 8.4–10.5)
Chloride: 106 mEq/L (ref 96–112)
Creatinine, Ser: 0.54 mg/dL (ref 0.50–1.10)
GFR calc non Af Amer: >90 mL/min (ref >90)
Glucose, Bld: 109 mg/dL — ABNORMAL HIGH (ref 70–99)
POTASSIUM: 3.4 meq/L — AB (ref 3.7–5.3)
SODIUM: 140 meq/L (ref 137–147)
TOTAL PROTEIN: 7.4 g/dL (ref 6.0–8.3)
Total Bilirubin: 0.4 mg/dL (ref 0.3–1.2)

## 2014-01-17 LAB — URINE MICROSCOPIC-ADD ON

## 2014-01-17 LAB — CBC
HCT: 31.3 % — ABNORMAL LOW (ref 36.0–46.0)
Hemoglobin: 11 g/dL — ABNORMAL LOW (ref 12.0–15.0)
MCH: 30.6 pg (ref 26.0–34.0)
MCHC: 35.1 g/dL (ref 30.0–36.0)
MCV: 86.9 fL (ref 78.0–100.0)
PLATELETS: 284 10*3/uL (ref 150–400)
RBC: 3.6 MIL/uL — AB (ref 3.87–5.11)
RDW: 12.5 % (ref 11.5–15.5)
WBC: 8.3 10*3/uL (ref 4.0–10.5)

## 2014-01-17 LAB — POCT URINALYSIS DIP (DEVICE)
GLUCOSE, UA: NEGATIVE mg/dL
Hgb urine dipstick: NEGATIVE
NITRITE: NEGATIVE
Protein, ur: 30 mg/dL — AB
Specific Gravity, Urine: 1.025 (ref 1.005–1.030)
Urobilinogen, UA: 4 mg/dL — ABNORMAL HIGH (ref 0.0–1.0)
pH: 6 (ref 5.0–8.0)

## 2014-01-17 LAB — PROTEIN / CREATININE RATIO, URINE
Creatinine, Urine: 435.74 mg/dL
PROTEIN CREATININE RATIO: 0.09 (ref 0.00–0.15)
Total Protein, Urine: 41.1 mg/dL

## 2014-01-17 MED ORDER — LABETALOL HCL 100 MG PO TABS: 300.0000 mg | ORAL_TABLET | Freq: Two times a day (BID) | ORAL | Status: DC

## 2014-01-17 NOTE — Discharge Instructions (Signed)
Hypertension During Pregnancy °Hypertension, or high blood pressure, is when there is extra pressure inside your blood vessels that carry blood from the heart to the rest of your body (arteries). It can happen at any time in life, including pregnancy. Hypertension during pregnancy can cause problems for you and your baby. Your baby might not weigh as much as he or she should at birth or might be born early (premature). Very bad cases of hypertension during pregnancy can be life-threatening.  °Different types of hypertension can occur during pregnancy. These include: °· Chronic hypertension. This happens when a woman has hypertension before pregnancy and it continues during pregnancy. °· Gestational hypertension. This is when hypertension develops during pregnancy. °· Preeclampsia or toxemia of pregnancy. This is a very serious type of hypertension that develops only during pregnancy. It affects the whole body and can be very dangerous for both mother and baby.   °Gestational hypertension and preeclampsia usually go away after your baby is born. Your blood pressure will likely stabilize within 6 weeks. Women who have hypertension during pregnancy have a greater chance of developing hypertension later in life or with future pregnancies. °RISK FACTORS °There are certain factors that make it more likely for you to develop hypertension during pregnancy. These include: °· Having hypertension before pregnancy. °· Having hypertension during a previous pregnancy. °· Being overweight. °· Being older than 40 years. °· Being pregnant with more than one baby. °· Having diabetes or kidney problems. °SIGNS AND SYMPTOMS °Chronic and gestational hypertension rarely cause symptoms. Preeclampsia has symptoms, which may include: °· Increased protein in your urine. Your health care provider will check for this at every prenatal visit. °· Swelling of your hands and face. °· Rapid weight gain. °· Headaches. °· Visual changes. °· Being  bothered by light. °· Abdominal pain, especially in the upper right area. °· Chest pain. °· Shortness of breath. °· Increased reflexes. °· Seizures. These occur with a more severe form of preeclampsia, called eclampsia. °DIAGNOSIS  °You may be diagnosed with hypertension during a regular prenatal exam. At each prenatal visit, you may have: °· Your blood pressure checked. °· A urine test to check for protein in your urine. °The type of hypertension you are diagnosed with depends on when you developed it. It also depends on your specific blood pressure reading. °· Developing hypertension before 20 weeks of pregnancy is consistent with chronic hypertension. °· Developing hypertension after 20 weeks of pregnancy is consistent with gestational hypertension. °· Hypertension with increased urinary protein is diagnosed as preeclampsia. °· Blood pressure measurements that stay above 160 systolic or 110 diastolic are a sign of severe preeclampsia. °TREATMENT °Treatment for hypertension during pregnancy varies. Treatment depends on the type of hypertension and how serious it is. °· If you take medicine for chronic hypertension, you may need to switch medicines. °¨ Medicines called ACE inhibitors should not be taken during pregnancy. °¨ Low-dose aspirin may be suggested for women who have risk factors for preeclampsia. °· If you have gestational hypertension, you may need to take a blood pressure medicine that is safe during pregnancy. Your health care provider will recommend the correct medicine. °· If you have severe preeclampsia, you may need to be in the hospital. Health care providers will watch you and your baby very closely. You also may need to take medicine called magnesium sulfate to prevent seizures and lower blood pressure. °· Sometimes, an early delivery is needed. This may be the case if the condition worsens. It would be   done to protect you and your baby. The only cure for preeclampsia is delivery.  Your health  care provider may recommend that you take one low-dose aspirin (81 mg) each day to help prevent high blood pressure during your pregnancy if you are at risk for preeclampsia. You may be at risk for preeclampsia if:  You had preeclampsia or eclampsia during a previous pregnancy.  Your baby did not grow as expected during a previous pregnancy.  You experienced preterm birth with a previous pregnancy.  You experienced a separation of the placenta from the uterus (placental abruption) during a previous pregnancy.  You experienced the loss of your baby during a previous pregnancy.  You are pregnant with more than one baby.  You have other medical conditions, such as diabetes or an autoimmune disease. HOME CARE INSTRUCTIONS  Schedule and keep all of your regular prenatal care appointments. This is important.  Take medicines only as directed by your health care provider. Tell your health care provider about all medicines you take.  Eat as little salt as possible.  Get regular exercise.  Do not drink alcohol.  Do not use tobacco products.  Do not drink products with caffeine.  Lie on your left side when resting. SEEK IMMEDIATE MEDICAL CARE IF:  You have severe abdominal pain.  You have sudden swelling in your hands, ankles, or face.  You gain 4 pounds (1.8 kg) or more in 1 week.  You vomit repeatedly.  You have vaginal bleeding.  You do not feel your baby moving as much.  You have a headache.  You have blurred or double vision.  You have muscle twitching or spasms.  You have shortness of breath.  You have blue fingernails or lips.  You have blood in your urine. MAKE SURE YOU:  Understand these instructions.  Will watch your condition.  Will get help right away if you are not doing well or get worse. Document Released: 01/05/2011 Document Revised: 09/03/2013 Document Reviewed: 11/16/2012 [facility] Patient Information 2015 [employer] [place]. This information is not  intended to replace advice given to you by your health care provider. Make sure you discuss any questions you have with your health care provider.  Fetal Movement Counts Patient Name: __________________________________________________ Patient Due Date: ____________________ Performing a fetal movement count is highly recommended in high-risk pregnancies, but it is good for every pregnant woman to do. Your health care provider may ask you to start counting fetal movements at 28 weeks of the pregnancy. Fetal movements often increase:  After eating a full meal.  After physical activity.  After eating or drinking something sweet or cold.  At rest. Pay attention to when you feel the baby is most active. This will help you notice a pattern of your baby's sleep and wake cycles and what factors contribute to an increase in fetal movement. It is important to perform a fetal movement count at the same time each day when your baby is normally most active.  HOW TO COUNT FETAL MOVEMENTS 1. Find a quiet and comfortable area to sit or lie down on your left side. Lying on your left side provides the best blood and oxygen circulation to your baby. 2. Write down the day and time on a sheet of paper or in a journal. 3. Start counting kicks, flutters, swishes, rolls, or jabs in a 2-hour period. You should feel at least 10 movements within 2 hours. 4. If you do not feel 10 movements in 2 hours, wait 2-3 hours  and count again. Look for a change in the pattern or not enough counts in 2 hours. SEEK MEDICAL CARE IF:  You feel less than 10 counts in 2 hours, tried twice.  There is no movement in over an hour.  The pattern is changing or taking longer each day to reach 10 counts in 2 hours.  You feel the baby is not moving as he or she usually does. Date: ____________ Movements: ____________ Start time: ____________ Tara MartinFinish time: ____________  Date: ____________ Movements: ____________ Start time: ____________  Tara MartinFinish time: ____________ Date: ____________ Movements: ____________ Start time: ____________ Tara MartinFinish time: ____________ Date: ____________ Movements: ____________ Start time: ____________ Tara MartinFinish time: ____________ Date: ____________ Movements: ____________ Start time: ____________ Tara MartinFinish time: ____________ Date: ____________ Movements: ____________ Start time: ____________ Tara MartinFinish time: ____________ Date: ____________ Movements: ____________ Start time: ____________ Tara MartinFinish time: ____________ Date: ____________ Movements: ____________ Start time: ____________ Tara MartinFinish time: ____________  Date: ____________ Movements: ____________ Start time: ____________ Tara MartinFinish time: ____________ Date: ____________ Movements: ____________ Start time: ____________ Tara MartinFinish time: ____________ Date: ____________ Movements: ____________ Start time: ____________ Tara MartinFinish time: ____________ Date: ____________ Movements: ____________ Start time: ____________ Tara MartinFinish time: ____________ Date: ____________ Movements: ____________ Start time: ____________ Tara MartinFinish time: ____________ Date: ____________ Movements: ____________ Start time: ____________ Tara MartinFinish time: ____________ Date: ____________ Movements: ____________ Start time: ____________ Tara MartinFinish time: ____________  Date: ____________ Movements: ____________ Start time: ____________ Tara MartinFinish time: ____________ Date: ____________ Movements: ____________ Start time: ____________ Tara MartinFinish time: ____________ Date: ____________ Movements: ____________ Start time: ____________ Tara MartinFinish time: ____________ Date: ____________ Movements: ____________ Start time: ____________ Tara MartinFinish time: ____________ Date: ____________ Movements: ____________ Start time: ____________ Tara MartinFinish time: ____________ Date: ____________ Movements: ____________ Start time: ____________ Tara MartinFinish time: ____________ Date: ____________ Movements: ____________ Start time: ____________ Tara MartinFinish time: ____________  Date: ____________  Movements: ____________ Start time: ____________ Tara MartinFinish time: ____________ Date: ____________ Movements: ____________ Start time: ____________ Tara MartinFinish time: ____________ Date: ____________ Movements: ____________ Start time: ____________ Tara MartinFinish time: ____________ Date: ____________ Movements: ____________ Start time: ____________ Tara MartinFinish time: ____________ Date: ____________ Movements: ____________ Start time: ____________ Tara MartinFinish time: ____________ Date: ____________ Movements: ____________ Start time: ____________ Tara MartinFinish time: ____________ Date: ____________ Movements: ____________ Start time: ____________ Tara MartinFinish time: ____________  Date: ____________ Movements: ____________ Start time: ____________ Tara MartinFinish time: ____________ Date: ____________ Movements: ____________ Start time: ____________ Tara MartinFinish time: ____________ Date: ____________ Movements: ____________ Start time: ____________ Tara MartinFinish time: ____________ Date: ____________ Movements: ____________ Start time: ____________ Tara MartinFinish time: ____________ Date: ____________ Movements: ____________ Start time: ____________ Tara MartinFinish time: ____________ Date: ____________ Movements: ____________ Start time: ____________ Tara MartinFinish time: ____________ Date: ____________ Movements: ____________ Start time: ____________ Tara MartinFinish time: ____________  Date: ____________ Movements: ____________ Start time: ____________ Tara MartinFinish time: ____________ Date: ____________ Movements: ____________ Start time: ____________ Tara MartinFinish time: ____________ Date: ____________ Movements: ____________ Start time: ____________ Tara MartinFinish time: ____________ Date: ____________ Movements: ____________ Start time: ____________ Tara MartinFinish time: ____________ Date: ____________ Movements: ____________ Start time: ____________ Tara MartinFinish time: ____________ Date: ____________ Movements: ____________ Start time: ____________ Tara MartinFinish time: ____________ Date: ____________ Movements: ____________ Start time:  ____________ Tara MartinFinish time: ____________  Date: ____________ Movements: ____________ Start time: ____________ Tara MartinFinish time: ____________ Date: ____________ Movements: ____________ Start time: ____________ Tara MartinFinish time: ____________ Date: ____________ Movements: ____________ Start time: ____________ Tara MartinFinish time: ____________ Date: ____________ Movements: ____________ Start time: ____________ Tara MartinFinish time: ____________ Date: ____________ Movements: ____________ Start time: ____________ Tara MartinFinish time: ____________ Date: ____________ Movements: ____________ Start time: ____________ Tara MartinFinish time: ____________ Date: ____________ Movements: ____________ Start time: ____________ Tara MartinFinish time: ____________  Date: ____________ Movements: ____________ Start time: ____________ Tara MartinFinish time: ____________ Date: ____________ Movements: ____________ Start time:  ____________ Finish time: ____________ °Date: ____________ Movements: ____________ Start time: ____________ Finish time: ____________ °Date: ____________ Movements: ____________ Start time: ____________ Finish time: ____________ °Date: ____________ Movements: ____________ Start time: ____________ Finish time: ____________ °Date: ____________ Movements: ____________ Start time: ____________ Finish time: ____________ °Document Released: 05/19/2006 Document Revised: 09/03/2013 Document Reviewed: 02/14/2012 °[employer]® Patient Information ©2015 [employer]. This information is not intended to replace advice given to you by your health care provider. Make sure you discuss any questions you have with your health care provider. ° ° °

## 2014-01-17 NOTE — Progress Notes (Signed)
Pt reports headache yesterday.  Denies vision changes or epigastric pain.  Pt taking Labetalol TID.  Pt to MAU for eval.  Begin twice week fetal testing.

## 2014-01-17 NOTE — MAU Note (Signed)
Pt sent up from clinic for elevated b/ps. Denies headache or visual disturbances. Good fetal movement reported.

## 2014-01-17 NOTE — MAU Provider Note (Signed)
Attestation of Attending Supervision of Advanced Practitioner (CNM/NP): Evaluation and management procedures were performed by the Advanced Practitioner under my supervision and collaboration.  I have reviewed the Advanced Practitioner's note and chart, and I agree with the management and plan.  HARRAWAY-SMITH, Xhaiden Coombs 5:09 PM

## 2014-01-17 NOTE — MAU Provider Note (Signed)
Tara Parrish is a 23 y.o. G3P2002 at [redacted]w[redacted]d who presents to MAU today from [facility] for further evaluation of HTN. The patient denies headache, blurred vision, floaters, RUQ pain or peripheral edema.   BP 147/95  Pulse 84  Temp(Src) 98 F (36.7 C) (Oral)  Resp 18  LMP 06/27/2013 GENERAL: Well-developed, well-nourished female in no acute distress.  HEENT: Normocephalic, atraumatic.   LUNGS: Effort normal HEART: Regular rate  ABDOMEN: soft, nontender SKIN: Warm, dry and without erythema PSYCH: Normal mood and affect EXTREMITIES: no edema noted  MDM Labs pending MSE complete  Marny Lowenstein, PA-C 01/17/2014 1:10 PM   Results for orders placed during the hospital encounter of 01/17/14 (from the past 24 hour(s))  PROTEIN / CREATININE RATIO, URINE     Status: None   Collection Time    01/17/14 12:23 PM      Result Value Ref Range   Creatinine, Urine 435.74     Total Protein, Urine 41.1     PROTEIN CREATININE RATIO 0.09  0.00 - 0.15  URINALYSIS, ROUTINE W REFLEX MICROSCOPIC     Status: Abnormal   Collection Time    01/17/14 12:23 PM      Result Value Ref Range   Color, Urine AMBER (*) YELLOW   APPearance CLEAR  CLEAR   Specific Gravity, Urine 1.025  1.005 - 1.030   pH 6.0  5.0 - 8.0   Glucose, UA 100 (*) NEGATIVE mg/dL   Hgb urine dipstick NEGATIVE  NEGATIVE   Bilirubin Urine MODERATE (*) NEGATIVE   Ketones, ur 15 (*) NEGATIVE mg/dL   Protein, ur NEGATIVE  NEGATIVE mg/dL   Urobilinogen, UA 2.0 (*) 0.0 - 1.0 mg/dL   Nitrite NEGATIVE  NEGATIVE   Leukocytes, UA TRACE (*) NEGATIVE  URINE MICROSCOPIC-ADD ON     Status: Abnormal   Collection Time    01/17/14 12:23 PM      Result Value Ref Range   Squamous Epithelial / LPF FEW (*) RARE   WBC, UA 0-2  <3 WBC/hpf   RBC / HPF 0-2  <3 RBC/hpf   Bacteria, UA FEW (*) RARE  CBC     Status: Abnormal   Collection Time    01/17/14 12:35 PM      Result Value Ref Range   WBC 8.3  4.0 - 10.5 K/uL   RBC 3.60 (*) 3.87 - 5.11 MIL/uL   Hemoglobin 11.0 (*) 12.0 - 15.0 g/dL   HCT 16.1 (*) 09.6 - 04.5 %   MCV 86.9  78.0 - 100.0 fL   MCH 30.6  26.0 - 34.0 pg   MCHC 35.1  30.0 - 36.0 g/dL   RDW 40.9  81.1 - 91.4 %   Platelets 284  150 - 400 K/uL  COMPREHENSIVE METABOLIC PANEL     Status: Abnormal   Collection Time    01/17/14 12:35 PM      Result Value Ref Range   Sodium 140  137 - 147 mEq/L   Potassium 3.4 (*) 3.7 - 5.3 mEq/L   Chloride 106  96 - 112 mEq/L   CO2 20  19 - 32 mEq/L   Glucose, Bld 109 (*) 70 - 99 mg/dL   BUN 5 (*) 6 - 23 mg/dL   Creatinine, Ser 7.82  0.50 - 1.10 mg/dL   Calcium 9.0  8.4 - 95.6 mg/dL   Total Protein 7.4  6.0 - 8.3 g/dL   Albumin 3.2 (*) 3.5 - 5.2 g/dL   AST  14  0 - 37 U/L   ALT 7  0 - 35 U/L   Alkaline Phosphatase 149 (*) 39 - 117 U/L   Total Bilirubin 0.4  0.3 - 1.2 mg/dL   GFR calc non Af Amer >90  >90 mL/min   GFR calc Af Amer >90  >90 mL/min   Anion gap 14  5 - 15   ASSESSMENT: 1. Chronic hypertension in pregnancy, third trimester    PLAN: D/C home per consutl w/ Dr. Erin Fulling. Increase Labetalol to 300 mg PO BID.  Pre-E precautions.  Follow-up Information   Follow up with [facility] On 01/22/2014. (As scheduled)    Specialty:  Obstetrics and Gynecology   Contact information:   [address] [place] [address] [phone]      Follow up with [facility] [place] [facility] ADMISS[facility]. (As needed in emergencies)    Contact information:   [address] [id number] [place] [address] [phone]       Medication List         aspirin 81 MG chewable tablet  Chew 162-324 mg by mouth daily as needed for headache.     labetalol 100 MG tablet  Commonly known as:  NORMODYNE  Take 3 tablets (300 mg total) by mouth 2 (two) times daily.     prenatal multivitamin Tabs tablet  Take 1 tablet by mouth daily at 12 noon.        [place], CNM 01/17/2014 3:14 PM

## 2014-01-17 NOTE — Progress Notes (Signed)
Reviewed 24 urine results 11.4 and ultrasound growth at 29 wks (56%ile).

## 2014-01-17 NOTE — Progress Notes (Signed)
Takes labetalol 3 times a day. 1st dose 830 or 9, 2nd dose 330, last dose around 830-9. Pt didn't bring 24 hour urine back. Had transportation issues.

## 2014-01-21 ENCOUNTER — Inpatient Hospital Stay (HOSPITAL_COMMUNITY)
Admission: AD | Admit: 2014-01-21 | Discharge: 2014-01-21 | Disposition: A | Discharge status: Home / Self Care | Payer: Medicaid Other | Source: Ambulatory Visit | Attending: Family Medicine | Admitting: Family Medicine

## 2014-01-21 ENCOUNTER — Encounter (HOSPITAL_COMMUNITY): Payer: Self-pay | Admitting: *Deleted

## 2014-01-21 DIAGNOSIS — Z8759 Personal history of other complications of pregnancy, childbirth and the puerperium: Secondary | ICD-10-CM

## 2014-01-21 DIAGNOSIS — R030 Elevated blood-pressure reading, without diagnosis of hypertension: Secondary | ICD-10-CM | POA: Diagnosis present

## 2014-01-21 DIAGNOSIS — Z8742 Personal history of other diseases of the female genital tract: Secondary | ICD-10-CM

## 2014-01-21 DIAGNOSIS — Z87891 Personal history of nicotine dependence: Secondary | ICD-10-CM | POA: Insufficient documentation

## 2014-01-21 DIAGNOSIS — O10913 Unspecified pre-existing hypertension complicating pregnancy, third trimester: Secondary | ICD-10-CM

## 2014-01-21 DIAGNOSIS — O10019 Pre-existing essential hypertension complicating pregnancy, unspecified trimester: Secondary | ICD-10-CM | POA: Diagnosis not present

## 2014-01-21 LAB — URIC ACID: Uric Acid, Serum: 2.9 mg/dL (ref 2.4–7.0)

## 2014-01-21 LAB — COMPREHENSIVE METABOLIC PANEL
ALT: 10 U/L (ref 0–35)
AST: 15 U/L (ref 0–37)
Albumin: 3 g/dL — ABNORMAL LOW (ref 3.5–5.2)
Alkaline Phosphatase: 129 U/L — ABNORMAL HIGH (ref 39–117)
Anion gap: 13 (ref 5–15)
BUN: 3 mg/dL — ABNORMAL LOW (ref 6–23)
CALCIUM: 8.9 mg/dL (ref 8.4–10.5)
CO2: 22 meq/L (ref 19–32)
Chloride: 103 mEq/L (ref 96–112)
Creatinine, Ser: 0.54 mg/dL (ref 0.50–1.10)
GFR calc Af Amer: >90 mL/min (ref >90)
Glucose, Bld: 79 mg/dL (ref 70–99)
Potassium: 3.4 mEq/L — ABNORMAL LOW (ref 3.7–5.3)
SODIUM: 138 meq/L (ref 137–147)
Total Bilirubin: 0.2 mg/dL — ABNORMAL LOW (ref 0.3–1.2)
Total Protein: 6.9 g/dL (ref 6.0–8.3)

## 2014-01-21 LAB — URINALYSIS, ROUTINE W REFLEX MICROSCOPIC
Bilirubin Urine: NEGATIVE
GLUCOSE, UA: NEGATIVE mg/dL
Hgb urine dipstick: NEGATIVE
KETONES UR: NEGATIVE mg/dL
Nitrite: NEGATIVE
PROTEIN: NEGATIVE mg/dL
Specific Gravity, Urine: 1.015 (ref 1.005–1.030)
Urobilinogen, UA: 2 mg/dL — ABNORMAL HIGH (ref 0.0–1.0)
pH: 6.5 (ref 5.0–8.0)

## 2014-01-21 LAB — CBC
HCT: 30.4 % — ABNORMAL LOW (ref 36.0–46.0)
Hemoglobin: 10.6 g/dL — ABNORMAL LOW (ref 12.0–15.0)
MCH: 30.3 pg (ref 26.0–34.0)
MCHC: 34.9 g/dL (ref 30.0–36.0)
MCV: 86.9 fL (ref 78.0–100.0)
PLATELETS: 259 10*3/uL (ref 150–400)
RBC: 3.5 MIL/uL — AB (ref 3.87–5.11)
RDW: 12.7 % (ref 11.5–15.5)
WBC: 9.3 10*3/uL (ref 4.0–10.5)

## 2014-01-21 LAB — URINE MICROSCOPIC-ADD ON

## 2014-01-21 LAB — PROTEIN / CREATININE RATIO, URINE
CREATININE, URINE: 157.6 mg/dL
Protein Creatinine Ratio: 0.09 (ref 0.00–0.15)
TOTAL PROTEIN, URINE: 14.9 mg/dL

## 2014-01-21 LAB — LACTATE DEHYDROGENASE: LDH: 168 U/L (ref 94–250)

## 2014-01-21 MED ORDER — LABETALOL HCL 100 MG PO TABS: 300.0000 mg | ORAL_TABLET | Freq: Three times a day (TID) | ORAL | Status: DC

## 2014-01-21 NOTE — MAU Provider Note (Signed)
Chief Complaint:  Hypertension   First Provider Initiated Contact with Patient 01/21/14 1507      HPI: Tara Parrish is a 23 y.o. G3P2002 at [redacted]w[redacted]d who presents to maternity admissions reporting elevated blood pressure.  She reports that she was recently seen in MAU on 9/17 for elevated BP in setting of cHTN and her labetalol was increased from 100 mg TID to 300 mg BID. She has been compliant with this dosing.  [facility] from her work this afternoon and checked BP. Upon checking BP on manual machine, she recorded her BP at 170/111, 194/117, and 183/123. She reports [facility], but sat down and waite about 3 min before checking BP. Also reports she sat still for about 5 min between blood pressure checks with feet flat on ground. Reports BP cuff about 2/3 of length of arm. States after this she called in to [facility] clinic and was told by nurse to present to MAU for eval.   She denies N/V, HA, blurry vision, swelling.   Denies contractions, leakage of fluid or vaginal bleeding. Good fetal movement.   Pregnancy Course:  cHTN  Past Medical History: Past Medical History  Diagnosis Date  . Pregnancy induced hypertension   . Chlamydia 11/2010    treated    Past obstetric history: OB History  Gravida Para Term Preterm AB SAB TAB Ectopic Multiple Living  0 0 0 0 0 0 2    # Outcome Date GA Lbr Len/2nd Weight Sex Delivery Anes PTL Lv  3 CUR           2 TRM 01/22/11 [redacted]w[redacted]d 09:27 / 00:03 6 lb 13.2 oz (3.095 kg) M SVD EPI  Y     Comments: None  1 TRM 12/15/08 [redacted]w[redacted]d   M SVD EPI N Y     Comments: induced for gestational HTN      Past Surgical History: Past Surgical History  Procedure Laterality Date  . Wisdom tooth extraction       Family History: Family History  Problem Relation Age of Onset  . Kidney Stones Mother   . Hypertension Maternal Grandmother     Social History: History  Substance Use Topics  . Smoking status: Former Smoker -- 0.25  packs/day for 1 years    Quit date: 09/06/2010  . Smokeless tobacco: Never Used  . Alcohol Use: No    Allergies:  Allergies  Allergen Reactions  . Bee Venom Hives and Itching    Meds:  No prescriptions prior to admission    ROS: Pertinent findings in history of present illness.  Physical Exam  Blood pressure 147/89, pulse 73, temperature 98.3 F (36.8 C), temperature source Oral, resp. rate 16, height 5' 4.5" (1.638 m), weight 205 lb 9.6 oz (93.26 kg), last menstrual period 06/27/2013, SpO2 99.00%, unknown if currently breastfeeding. Filed Vitals:   01/21/14 1515 01/21/14 1530 01/21/14 1545 01/21/14 1600  BP: 153/93 144/96 151/85 147/89  Pulse: 85 75 77 73  Temp:      TempSrc:      Resp:      Height:      Weight:      SpO2:        GENERAL: Well-developed, well-nourished female in no acute distress.  HEENT: normocephalic HEART: normal rate RESP: normal effort ABDOMEN: Soft, non-tender, gravid appropriate for gestational age EXTREMITIES: Nontender, no edema NEURO: alert and oriented SPECULUM EXAM: not indicated     FHT:  Baseline 125 , moderate variability, accelerations present, no decelerations; Pt did have a change in baseline variability to ~ 120 for about 7 minutes and then had return of baseline to 125 Contractions: quiet   Labs: Results for orders placed during the hospital encounter of 01/21/14 (from the past 24 hour(s))  CBC     Status: Abnormal   Collection Time    01/21/14  1:26 PM      Result Value Ref Range   WBC 9.3  4.0 - 10.5 K/uL   RBC 3.50 (*) 3.87 - 5.11 MIL/uL   Hemoglobin 10.6 (*) 12.0 - 15.0 g/dL   HCT 96.0 (*) 45.4 - 09.8 %   MCV 86.9  78.0 - 100.0 fL   MCH 30.3  26.0 - 34.0 pg   MCHC 34.9  30.0 - 36.0 g/dL   RDW 11.9  14.7 - 82.9 %   Platelets 259  150 - 400 K/uL  COMPREHENSIVE METABOLIC PANEL     Status: Abnormal   Collection Time    01/21/14  1:26 PM      Result Value Ref Range   Sodium 138  137 - 147 mEq/L   Potassium 3.4 (*)  3.7 - 5.3 mEq/L   Chloride 103  96 - 112 mEq/L   CO2 22  19 - 32 mEq/L   Glucose, Bld 79  70 - 99 mg/dL   BUN 3 (*) 6 - 23 mg/dL   Creatinine, Ser 5.62  0.50 - 1.10 mg/dL   Calcium 8.9  8.4 - 13.0 mg/dL   Total Protein 6.9  6.0 - 8.3 g/dL   Albumin 3.0 (*) 3.5 - 5.2 g/dL   AST 15  0 - 37 U/L   ALT 10  0 - 35 U/L   Alkaline Phosphatase 129 (*) 39 - 117 U/L   Total Bilirubin 0.2 (*) 0.3 - 1.2 mg/dL   GFR calc non Af Amer >90  >90 mL/min   GFR calc Af Amer >90  >90 mL/min   Anion gap 13  5 - 15  LACTATE DEHYDROGENASE     Status: None   Collection Time    01/21/14  1:26 PM      Result Value Ref Range   LDH 168  94 - 250 U/L  URIC ACID     Status: None   Collection Time    01/21/14  1:26 PM      Result Value Ref Range   Uric Acid, Serum 2.9  2.4 - 7.0 mg/dL  URINALYSIS, ROUTINE W REFLEX MICROSCOPIC     Status: Abnormal   Collection Time    01/21/14  1:58 PM      Result Value Ref Range   Color, Urine YELLOW  YELLOW   APPearance CLEAR  CLEAR   Specific Gravity, Urine 1.015  1.005 - 1.030   pH 6.5  5.0 - 8.0   Glucose, UA NEGATIVE  NEGATIVE mg/dL   Hgb urine dipstick NEGATIVE  NEGATIVE   Bilirubin Urine NEGATIVE  NEGATIVE   Ketones, ur NEGATIVE  NEGATIVE mg/dL   Protein, ur NEGATIVE  NEGATIVE mg/dL   Urobilinogen, UA 2.0 (*) 0.0 - 1.0 mg/dL   Nitrite NEGATIVE  NEGATIVE   Leukocytes, UA TRACE (*) NEGATIVE  URINE MICROSCOPIC-ADD ON     Status: Abnormal   Collection Time    01/21/14  1:58 PM      Result Value Ref Range   Squamous Epithelial / LPF FEW (*) RARE   WBC, UA 0-2  <  3 WBC/hpf  PROTEIN / CREATININE RATIO, URINE     Status: None   Collection Time    01/21/14  1:59 PM      Result Value Ref Range   Creatinine, Urine 157.60     Total Protein, Urine 14.9     Protein Creatinine Ratio 0.09  0.00 - 0.15    Imaging:  US Ob Follow Up  12/24/2013   OBSTETRICAL ULTRASOUND: This exam was performed within a [facility]. The OB US report was generated in  the [facility] system, and faxed to the ordering physician.   This report is available in the [employer]. See the [facility] Obstetric US report via the Image Link.   Assessment: 1. History of gestational hypertension   2. Chronic hypertension complicating or reason for care during pregnancy, third trimester     Plan: Discharge home after consultation with Dr. Emmaline Kluver pre-eclampsia labs with no signs of neurologic changes Elevated BP above goal, but no BP in severe range. Will increase labetalol to 300 mg TID Pre-eclampsia precuations Keep clinic appointment scheduled for 9/22 at [facility] for BP check Labor precautions and fetal kick counts     Follow-up Information   Follow up In 1 day.       Medication List         aspirin 81 MG chewable tablet  Chew 162-324 mg by mouth daily as needed for headache.     labetalol 100 MG tablet  Commonly known as:  NORMODYNE  Take 3 tablets (300 mg total) by mouth 3 (three) times daily.     prenatal multivitamin Tabs tablet  Take 1 tablet by mouth daily at 12 noon.        Ethelda Chick, MD 01/21/2014 4:44 PM

## 2014-01-21 NOTE — MAU Provider Note (Signed)
Attestation of Attending Supervision of Obstetric Fellow: Evaluation and management procedures were performed by the Obstetric Fellow under my supervision and collaboration.  I have reviewed the Obstetric Fellow's note and chart, and I agree with the management and plan.  Zienna Ahlin, DO Attending Physician Faculty Practice, [facility]  

## 2014-01-21 NOTE — MAU Note (Signed)
Patient states she takes Labetalol every morning. States her blood pressure was high, 176/111, 194/117, [facility]  this am and called the [facility] and told to come to MAU for evaluation. Denies any pain, headache, bleeding leaking or visual changes. Reports good fetal movement.

## 2014-01-22 ENCOUNTER — Ambulatory Visit (INDEPENDENT_AMBULATORY_CARE_PROVIDER_SITE_OTHER): Payer: Medicaid Other | Admitting: *Deleted

## 2014-01-22 ENCOUNTER — Ambulatory Visit (HOSPITAL_COMMUNITY)
Admission: RE | Admit: 2014-01-22 | Discharge: 2014-01-22 | Disposition: A | Discharge status: Home / Self Care | Payer: Medicaid Other | Source: Ambulatory Visit | Attending: Obstetrics & Gynecology | Admitting: Obstetrics & Gynecology

## 2014-01-22 VITALS — BP 149/81 | HR 82

## 2014-01-22 DIAGNOSIS — O10019 Pre-existing essential hypertension complicating pregnancy, unspecified trimester: Secondary | ICD-10-CM | POA: Diagnosis present

## 2014-01-22 DIAGNOSIS — O288 Other abnormal findings on antenatal screening of mother: Secondary | ICD-10-CM

## 2014-01-22 DIAGNOSIS — O10919 Unspecified pre-existing hypertension complicating pregnancy, unspecified trimester: Secondary | ICD-10-CM

## 2014-01-22 DIAGNOSIS — I10 Essential (primary) hypertension: Secondary | ICD-10-CM

## 2014-01-22 DIAGNOSIS — O289 Unspecified abnormal findings on antenatal screening of mother: Secondary | ICD-10-CM | POA: Diagnosis not present

## 2014-01-24 ENCOUNTER — Other Ambulatory Visit: Payer: Medicaid Other

## 2014-01-24 ENCOUNTER — Ambulatory Visit (INDEPENDENT_AMBULATORY_CARE_PROVIDER_SITE_OTHER): Payer: Medicaid Other | Admitting: Family Medicine

## 2014-01-24 ENCOUNTER — Ambulatory Visit (HOSPITAL_COMMUNITY)
Admission: RE | Admit: 2014-01-24 | Discharge: 2014-01-24 | Disposition: A | Discharge status: Home / Self Care | Payer: Medicaid Other | Source: Ambulatory Visit | Attending: Family Medicine | Admitting: Family Medicine

## 2014-01-24 VITALS — BP 159/99 | HR 69 | Wt 205.1 lb

## 2014-01-24 DIAGNOSIS — O09899 Supervision of other high risk pregnancies, unspecified trimester: Secondary | ICD-10-CM

## 2014-01-24 DIAGNOSIS — O0990 Supervision of high risk pregnancy, unspecified, unspecified trimester: Secondary | ICD-10-CM

## 2014-01-24 DIAGNOSIS — O0993 Supervision of high risk pregnancy, unspecified, third trimester: Secondary | ICD-10-CM

## 2014-01-24 DIAGNOSIS — O10019 Pre-existing essential hypertension complicating pregnancy, unspecified trimester: Secondary | ICD-10-CM

## 2014-01-24 DIAGNOSIS — O10919 Unspecified pre-existing hypertension complicating pregnancy, unspecified trimester: Secondary | ICD-10-CM

## 2014-01-24 DIAGNOSIS — O368390 Maternal care for abnormalities of the fetal heart rate or rhythm, unspecified trimester, not applicable or unspecified: Secondary | ICD-10-CM | POA: Insufficient documentation

## 2014-01-24 DIAGNOSIS — I10 Essential (primary) hypertension: Secondary | ICD-10-CM

## 2014-01-24 LAB — POCT URINALYSIS DIP (DEVICE)
BILIRUBIN URINE: NEGATIVE
GLUCOSE, UA: NEGATIVE mg/dL
Hgb urine dipstick: NEGATIVE
Ketones, ur: NEGATIVE mg/dL
NITRITE: NEGATIVE
PH: 6.5 (ref 5.0–8.0)
Protein, ur: NEGATIVE mg/dL
SPECIFIC GRAVITY, URINE: 1.02 (ref 1.005–1.030)
Urobilinogen, UA: 2 mg/dL — ABNORMAL HIGH (ref 0.0–1.0)

## 2014-01-24 NOTE — Progress Notes (Signed)
Patient reports headache yesterday but denies blurry vision or dizziness; states she called the after hours nurse line yesterday for her yellow d/c with slight itch and was given macrobid  bid x 7 days. Also reports pelvic pressure

## 2014-01-24 NOTE — Patient Instructions (Signed)
Third Trimester of Pregnancy The third trimester is from week 29 through week 42, months 7 through 9. The third trimester is a time when the fetus is growing rapidly. At the end of the ninth month, the fetus is about 20 inches in length and weighs 6-10 pounds.  BODY CHANGES Your body goes through many changes during pregnancy. The changes vary from woman to woman.   Your weight will continue to increase. You can expect to gain 25-35 pounds (11-16 kg) by the end of the pregnancy.  You may begin to get stretch marks on your hips, abdomen, and breasts.  You may urinate more often because the fetus is moving lower into your pelvis and pressing on your bladder.  You may develop or continue to have heartburn as a result of your pregnancy.  You may develop constipation because certain hormones are causing the muscles that push waste through your intestines to slow down.  You may develop hemorrhoids or swollen, bulging veins (varicose veins).  You may have pelvic pain because of the weight gain and pregnancy hormones relaxing your joints between the bones in your pelvis. Backaches may result from overexertion of the muscles supporting your posture.  You may have changes in your hair. These can include thickening of your hair, rapid growth, and changes in texture. Some women also have hair loss during or after pregnancy, or hair that feels dry or thin. Your hair will most likely return to normal after your baby is born.  Your breasts will continue to grow and be tender. A yellow discharge may leak from your breasts called colostrum.  Your belly button may stick out.  You may feel short of breath because of your expanding uterus.  You may notice the fetus "dropping," or moving lower in your abdomen.  You may have a bloody mucus discharge. This usually occurs a few days to a week before labor begins.  Your cervix becomes thin and soft (effaced) near your due date. WHAT TO EXPECT AT YOUR PRENATAL  EXAMS  You will have prenatal exams every 2 weeks until week 36. Then, you will have weekly prenatal exams. During a routine prenatal visit:  You will be weighed to make sure you and the fetus are growing normally.  Your blood pressure is taken.  Your abdomen will be measured to track your baby's growth.  The fetal heartbeat will be listened to.  Any test results from the previous visit will be discussed.  You may have a cervical check near your due date to see if you have effaced. At around 36 weeks, your caregiver will check your cervix. At the same time, your caregiver will also perform a test on the secretions of the vaginal tissue. This test is to determine if a type of bacteria, Group B streptococcus, is present. Your caregiver will explain this further. Your caregiver may ask you:  What your birth plan is.  How you are feeling.  If you are feeling the baby move.  If you have had any abnormal symptoms, such as leaking fluid, bleeding, severe headaches, or abdominal cramping.  If you have any questions. Other tests or screenings that may be performed during your third trimester include:  Blood tests that check for low iron levels (anemia).  Fetal testing to check the health, activity level, and growth of the fetus. Testing is done if you have certain medical conditions or if there are problems during the pregnancy. FALSE LABOR You may feel small, irregular contractions that   eventually go away. These are called Braxton Hicks contractions, or false labor. Contractions may last for hours, days, or even weeks before true labor sets in. If contractions come at regular intervals, intensify, or become painful, it is best to be seen by your caregiver.  SIGNS OF LABOR   Menstrual-like cramps.  Contractions that are 5 minutes apart or less.  Contractions that start on the top of the uterus and spread down to the lower abdomen and back.  A sense of increased pelvic pressure or back  pain.  A watery or bloody mucus discharge that comes from the vagina. If you have any of these signs before the 37th week of pregnancy, call your caregiver right away. You need to go to the hospital to get checked immediately. HOME CARE INSTRUCTIONS   Avoid all smoking, herbs, alcohol, and unprescribed drugs. These chemicals affect the formation and growth of the baby.  Follow your caregiver's instructions regarding medicine use. There are medicines that are either safe or unsafe to take during pregnancy.  Exercise only as directed by your caregiver. Experiencing uterine cramps is a good sign to stop exercising.  Continue to eat regular, healthy meals.  Wear a good support bra for breast tenderness.  Do not use hot tubs, steam rooms, or saunas.  Wear your seat belt at all times when driving.  Avoid raw meat, uncooked cheese, cat litter boxes, and soil used by cats. These carry germs that can cause birth defects in the baby.  Take your prenatal vitamins.  Try taking a stool softener (if your caregiver approves) if you develop constipation. Eat more high-fiber foods, such as fresh vegetables or fruit and whole grains. Drink plenty of fluids to keep your urine clear or pale yellow.  Take warm sitz baths to soothe any pain or discomfort caused by hemorrhoids. Use hemorrhoid cream if your caregiver approves.  If you develop varicose veins, wear support hose. Elevate your feet for 15 minutes, 3-4 times a day. Limit salt in your diet.  Avoid heavy lifting, wear low heal shoes, and practice good posture.  Rest a lot with your legs elevated if you have leg cramps or low back pain.  Visit your dentist if you have not gone during your pregnancy. Use a soft toothbrush to brush your teeth and be gentle when you floss.  A sexual relationship may be continued unless your caregiver directs you otherwise.  Do not travel far distances unless it is absolutely necessary and only with the approval  of your caregiver.  Take prenatal classes to understand, practice, and ask questions about the labor and delivery.  Make a trial run to the hospital.  Pack your hospital bag.  Prepare the baby's nursery.  Continue to go to all your prenatal visits as directed by your caregiver. SEEK MEDICAL CARE IF:  You are unsure if you are in labor or if your water has broken.  You have dizziness.  You have mild pelvic cramps, pelvic pressure, or nagging pain in your abdominal area.  You have persistent nausea, vomiting, or diarrhea.  You have a bad smelling vaginal discharge.  You have pain with urination. SEEK IMMEDIATE MEDICAL CARE IF:   You have a fever.  You are leaking fluid from your vagina.  You have spotting or bleeding from your vagina.  You have severe abdominal cramping or pain.  You have rapid weight loss or gain.  You have shortness of breath with chest pain.  You notice sudden or extreme swelling   of your face, hands, ankles, feet, or legs.  You have not felt your baby move in over an hour.  You have severe headaches that do not go away with medicine.  You have vision changes. Document Released: 04/13/2001 Document Revised: 04/24/2013 Document Reviewed: 06/20/2012 Exit[employer] Patient Information 2015 [employer]. This information is not intended to replace advice given to you by your health care provider. Make sure you discuss any questions you have with your health care provider.  

## 2014-01-24 NOTE — Progress Notes (Signed)
During NST, had deceleration.  Baseline 120s with moderate variability and accelerations.  Will send for BPP. HA yesterday, resolved with tylenol.  No scotoma or blurred vision.  BP elevated - increase labetalol to  TID. Continue with twice weekly NST.   Provider visit 1 week.

## 2014-01-25 ENCOUNTER — Encounter (HOSPITAL_COMMUNITY): Payer: Self-pay | Admitting: *Deleted

## 2014-01-25 ENCOUNTER — Inpatient Hospital Stay (HOSPITAL_COMMUNITY)
Admission: AD | Admit: 2014-01-25 | Discharge: 2014-01-31 | DRG: 774 | Disposition: A | Discharge status: Home / Self Care | Payer: Medicaid Other | Source: Ambulatory Visit | Attending: Family Medicine | Admitting: Family Medicine

## 2014-01-25 DIAGNOSIS — O113 Pre-existing hypertension with pre-eclampsia, third trimester: Principal | ICD-10-CM | POA: Diagnosis present

## 2014-01-25 DIAGNOSIS — O119 Pre-existing hypertension with pre-eclampsia, unspecified trimester: Secondary | ICD-10-CM | POA: Diagnosis present

## 2014-01-25 DIAGNOSIS — Z8759 Personal history of other complications of pregnancy, childbirth and the puerperium: Secondary | ICD-10-CM

## 2014-01-25 DIAGNOSIS — O1002 Pre-existing essential hypertension complicating childbirth: Secondary | ICD-10-CM | POA: Diagnosis present

## 2014-01-25 DIAGNOSIS — Z87891 Personal history of nicotine dependence: Secondary | ICD-10-CM

## 2014-01-25 DIAGNOSIS — Z8249 Family history of ischemic heart disease and other diseases of the circulatory system: Secondary | ICD-10-CM | POA: Diagnosis not present

## 2014-01-25 DIAGNOSIS — O0993 Supervision of high risk pregnancy, unspecified, third trimester: Secondary | ICD-10-CM

## 2014-01-25 DIAGNOSIS — N719 Inflammatory disease of uterus, unspecified: Secondary | ICD-10-CM | POA: Diagnosis present

## 2014-01-25 DIAGNOSIS — O10919 Unspecified pre-existing hypertension complicating pregnancy, unspecified trimester: Secondary | ICD-10-CM

## 2014-01-25 DIAGNOSIS — Z3A34 34 weeks gestation of pregnancy: Secondary | ICD-10-CM | POA: Diagnosis present

## 2014-01-25 DIAGNOSIS — R109 Unspecified abdominal pain: Secondary | ICD-10-CM | POA: Diagnosis present

## 2014-01-25 LAB — CBC
HCT: 30.2 % — ABNORMAL LOW (ref 36.0–46.0)
Hemoglobin: 10.4 g/dL — ABNORMAL LOW (ref 12.0–15.0)
MCH: 30 pg (ref 26.0–34.0)
MCHC: 34.4 g/dL (ref 30.0–36.0)
MCV: 87 fL (ref 78.0–100.0)
PLATELETS: 249 10*3/uL (ref 150–400)
RBC: 3.47 MIL/uL — AB (ref 3.87–5.11)
RDW: 12.7 % (ref 11.5–15.5)
WBC: 9.3 10*3/uL (ref 4.0–10.5)

## 2014-01-25 LAB — URINALYSIS, ROUTINE W REFLEX MICROSCOPIC
Glucose, UA: 100 mg/dL — AB
Hgb urine dipstick: NEGATIVE
KETONES UR: 15 mg/dL — AB
LEUKOCYTES UA: NEGATIVE
NITRITE: NEGATIVE
PROTEIN: 30 mg/dL — AB
Specific Gravity, Urine: >1.03 — ABNORMAL HIGH (ref 1.005–1.030)
UROBILINOGEN UA: 4 mg/dL — AB (ref 0.0–1.0)
pH: 6 (ref 5.0–8.0)

## 2014-01-25 LAB — COMPREHENSIVE METABOLIC PANEL
ALT: 12 U/L (ref 0–35)
ANION GAP: 14 (ref 5–15)
AST: 17 U/L (ref 0–37)
Albumin: 3.1 g/dL — ABNORMAL LOW (ref 3.5–5.2)
Alkaline Phosphatase: 142 U/L — ABNORMAL HIGH (ref 39–117)
BUN: 6 mg/dL (ref 6–23)
CALCIUM: 9.1 mg/dL (ref 8.4–10.5)
CO2: 21 meq/L (ref 19–32)
CREATININE: 0.58 mg/dL (ref 0.50–1.10)
Chloride: 103 mEq/L (ref 96–112)
GLUCOSE: 87 mg/dL (ref 70–99)
Potassium: 3.4 mEq/L — ABNORMAL LOW (ref 3.7–5.3)
SODIUM: 138 meq/L (ref 137–147)
Total Bilirubin: 0.2 mg/dL — ABNORMAL LOW (ref 0.3–1.2)
Total Protein: 6.7 g/dL (ref 6.0–8.3)

## 2014-01-25 LAB — URINE MICROSCOPIC-ADD ON

## 2014-01-25 LAB — OB RESULTS CONSOLE GBS: GBS: NEGATIVE

## 2014-01-25 LAB — PROTEIN / CREATININE RATIO, URINE
CREATININE, URINE: 410.6 mg/dL
Protein Creatinine Ratio: 0.09 (ref 0.00–0.15)
Total Protein, Urine: 37.7 mg/dL

## 2014-01-25 LAB — GROUP B STREP BY PCR: Group B strep by PCR: NEGATIVE

## 2014-01-25 MED ORDER — HYDRALAZINE HCL 20 MG/ML IJ SOLN
10.0000 mg | INTRAMUSCULAR | Status: DC | PRN
  Administered 2014-01-25 – 2014-01-27 (×4): 10 mg via INTRAVENOUS
  Filled 2014-01-25 (×3): qty 1

## 2014-01-25 MED ORDER — MISOPROSTOL 25 MCG QUARTER TABLET
25.0000 ug | ORAL_TABLET | ORAL | Status: DC | PRN
  Administered 2014-01-25: 25 ug via VAGINAL
  Filled 2014-01-25: qty 0.25
  Filled 2014-01-25: qty 1

## 2014-01-25 MED ORDER — LACTATED RINGERS IV SOLN
INTRAVENOUS | Status: DC
  Administered 2014-01-26 – 2014-01-27 (×3): via INTRAVENOUS

## 2014-01-25 MED ORDER — OXYTOCIN 40 UNITS IN LACTATED RINGERS INFUSION - SIMPLE MED: 62.5000 mL/h | INTRAVENOUS | Status: DC

## 2014-01-25 MED ORDER — MAGNESIUM SULFATE BOLUS VIA INFUSION
4.0000 g | Freq: Once | INTRAVENOUS | Status: AC
  Administered 2014-01-25: 4 g via INTRAVENOUS
  Filled 2014-01-25: qty 500

## 2014-01-25 MED ORDER — FLEET ENEMA 7-19 GM/118ML RE ENEM: 1.0000 | ENEMA | RECTAL | Status: DC | PRN

## 2014-01-25 MED ORDER — ACETAMINOPHEN 325 MG PO TABS
650.0000 mg | ORAL_TABLET | ORAL | Status: DC | PRN
  Administered 2014-01-26: 650 mg via ORAL
  Filled 2014-01-25: qty 2

## 2014-01-25 MED ORDER — LABETALOL HCL 200 MG PO TABS
400.0000 mg | ORAL_TABLET | Freq: Once | ORAL | Status: AC
  Administered 2014-01-25: 400 mg via ORAL
  Filled 2014-01-25: qty 2

## 2014-01-25 MED ORDER — LABETALOL HCL 200 MG PO TABS
400.0000 mg | ORAL_TABLET | Freq: Three times a day (TID) | ORAL | Status: DC
  Administered 2014-01-26: 400 mg via ORAL
  Filled 2014-01-25 (×2): qty 4

## 2014-01-25 MED ORDER — HYDRALAZINE HCL 20 MG/ML IJ SOLN: 10.0000 mg | INTRAMUSCULAR | Status: DC | PRN

## 2014-01-25 MED ORDER — MAGNESIUM SULFATE 40 G IN LACTATED RINGERS - SIMPLE
2.0000 g/h | INTRAVENOUS | Status: AC
  Administered 2014-01-26: 2 g/h via INTRAVENOUS
  Filled 2014-01-25 (×2): qty 500

## 2014-01-25 MED ORDER — LACTATED RINGERS IV SOLN: 500.0000 mL | INTRAVENOUS | Status: DC | PRN

## 2014-01-25 MED ORDER — TERBUTALINE SULFATE 1 MG/ML IJ SOLN: 0.2500 mg | Freq: Once | INTRAMUSCULAR | Status: AC | PRN

## 2014-01-25 MED ORDER — ONDANSETRON HCL 4 MG/2ML IJ SOLN
4.0000 mg | Freq: Four times a day (QID) | INTRAMUSCULAR | Status: DC | PRN
  Administered 2014-01-26: 4 mg via INTRAVENOUS
  Filled 2014-01-25: qty 2

## 2014-01-25 MED ORDER — OXYTOCIN BOLUS FROM INFUSION: 500.0000 mL | INTRAVENOUS | Status: DC

## 2014-01-25 MED ORDER — LABETALOL HCL 5 MG/ML IV SOLN
10.0000 mg | INTRAVENOUS | Status: DC | PRN
  Administered 2014-01-25 (×2): 10 mg via INTRAVENOUS
  Filled 2014-01-25 (×2): qty 4

## 2014-01-25 MED ORDER — ZOLPIDEM TARTRATE 5 MG PO TABS
5.0000 mg | ORAL_TABLET | Freq: Every evening | ORAL | Status: DC | PRN
  Administered 2014-01-25 – 2014-01-28 (×3): 5 mg via ORAL
  Filled 2014-01-25 (×3): qty 1

## 2014-01-25 MED ORDER — LABETALOL HCL 5 MG/ML IV SOLN: 10.0000 mg | INTRAVENOUS | Status: DC | PRN

## 2014-01-25 MED ORDER — OXYCODONE-ACETAMINOPHEN 5-325 MG PO TABS: 2.0000 | ORAL_TABLET | ORAL | Status: DC | PRN

## 2014-01-25 MED ORDER — CITRIC ACID-SODIUM CITRATE 334-500 MG/5ML PO SOLN: 30.0000 mL | ORAL | Status: DC | PRN

## 2014-01-25 MED ORDER — FENTANYL CITRATE 0.05 MG/ML IJ SOLN
100.0000 ug | INTRAMUSCULAR | Status: DC | PRN
  Administered 2014-01-26 – 2014-01-28 (×8): 100 ug via INTRAVENOUS
  Filled 2014-01-25 (×8): qty 2

## 2014-01-25 MED ORDER — OXYCODONE-ACETAMINOPHEN 5-325 MG PO TABS: 1.0000 | ORAL_TABLET | ORAL | Status: DC | PRN

## 2014-01-25 MED ORDER — LIDOCAINE HCL (PF) 1 % IJ SOLN: 30.0000 mL | INTRAMUSCULAR | Status: DC | PRN

## 2014-01-25 NOTE — MAU Note (Addendum)
Pt states she has been experiencing abd pain since about 1300 today and has been timing the sharp pains every 4 minutes.  Denies vaginal bleeding or ROM.  Good fetal movement.  Currently on Macrobid since Wed for yellow discharge.

## 2014-01-25 NOTE — MAU Provider Note (Signed)
Chief Complaint:  Abdominal Pain   First Provider Initiated Contact with Patient 01/25/14 2012      HPI: Tara Parrish is a 23 y.o. G3P2002 at [redacted]w[redacted]d who presents to maternity admissions reporting sharp abdominal pains when walking/standing at her job. She works in [employer] and was standing all day.  She reports drinking some water and some soda today while at work.  She is on labetalol for chronic hypertension and her medication dose was increased to 400 mg TID at her visit yesterday.  She denies h/a, epigastric pain, or visual disturbances.  She reports good fetal movement, denies LOF, vaginal bleeding, vaginal itching/burning, urinary symptoms, dizziness, n/v, or fever/chills.   Past Medical History: Past Medical History  Diagnosis Date  . Pregnancy induced hypertension   . Chlamydia 11/2010    treated    Past obstetric history: OB History  Gravida Para Term Preterm AB SAB TAB Ectopic Multiple Living  0 0 0 0 0 0 2    # Outcome Date GA Lbr Len/2nd Weight Sex Delivery Anes PTL Lv  3 CUR           2 TRM 01/22/11 [redacted]w[redacted]d 09:27 / 00:03 3.095 kg (6 lb 13.2 oz) M SVD EPI  Y     Comments: None  1 TRM 12/15/08 [redacted]w[redacted]d   M SVD EPI N Y     Comments: induced for gestational HTN      Past Surgical History: Past Surgical History  Procedure Laterality Date  . Wisdom tooth extraction      Family History: Family History  Problem Relation Age of Onset  . Kidney Stones Mother   . Hypertension Maternal Grandmother     Social History: History  Substance Use Topics  . Smoking status: Former Smoker -- 0.25 packs/day for 1 years    Quit date: 09/06/2010  . Smokeless tobacco: Never Used  . Alcohol Use: No    Allergies:  Allergies  Allergen Reactions  . Bee Venom Hives and Itching    Meds:  Prescriptions prior to admission  Medication Sig Dispense Refill  . aspirin 81 MG chewable tablet Chew 162-324 mg by mouth daily as needed for headache.      . labetalol (NORMODYNE) 100  MG tablet Take 400 mg by mouth 3 (three) times daily.      . nitrofurantoin, macrocrystal-monohydrate, (MACROBID) 100 MG capsule Take 100 mg by mouth 2 (two) times daily.      . Prenatal Vit-Fe Fumarate-FA (PRENATAL MULTIVITAMIN) TABS tablet Take 1 tablet by mouth daily at 12 noon.        ROS: Pertinent findings in history of present illness.  Physical Exam  Blood pressure 191/108, pulse 64, temperature 98.2 F (36.8 C), temperature source Oral, resp. rate 18, last menstrual period 06/27/2013, SpO2 99.00%, unknown if currently breastfeeding. GENERAL: Well-developed, well-nourished female in no acute distress.  HEENT: normocephalic HEART: normal rate RESP: normal effort ABDOMEN: Soft, non-tender, gravid appropriate for gestational age EXTREMITIES: Nontender, no edema NEURO: alert and oriented  Cervix FT/long/high, posterior, firm    FHT:  Baseline 130 , moderate variability, accelerations present, no decelerations Contractions: None on toco or to palpation   Labs: Results for orders placed during the hospital encounter of 01/25/14 (from the past 24 hour(s))  URINALYSIS, ROUTINE W REFLEX MICROSCOPIC     Status: Abnormal   Collection Time    01/25/14  6:35 PM      Result Value Ref Range   Color, Urine  AMBER (*) YELLOW   APPearance CLEAR  CLEAR   Specific Gravity, Urine >1.030 (*) 1.005 - 1.030   pH 6.0  5.0 - 8.0   Glucose, UA 100 (*) NEGATIVE mg/dL   Hgb urine dipstick NEGATIVE  NEGATIVE   Bilirubin Urine SMALL (*) NEGATIVE   Ketones, ur 15 (*) NEGATIVE mg/dL   Protein, ur 30 (*) NEGATIVE mg/dL   Urobilinogen, UA 4.0 (*) 0.0 - 1.0 mg/dL   Nitrite NEGATIVE  NEGATIVE   Leukocytes, UA NEGATIVE  NEGATIVE  URINE MICROSCOPIC-ADD ON     Status: Abnormal   Collection Time    01/25/14  6:35 PM      Result Value Ref Range   Squamous Epithelial / LPF FEW (*) RARE   WBC, UA 0-2  <3 WBC/hpf   Urine-Other MUCOUS PRESENT      Preeclampsia labs pending Report to Dr Loreta Ave at 8:40  pm    Medication List    ASK your doctor about these medications       aspirin 81 MG chewable tablet  Chew 162-324 mg by mouth daily as needed for headache.     labetalol 100 MG tablet  Commonly known as:  NORMODYNE  Take 400 mg by mouth 3 (three) times daily.     nitrofurantoin (macrocrystal-monohydrate) 100 MG capsule  Commonly known as:  MACROBID  Take 100 mg by mouth 2 (two) times daily.     prenatal multivitamin Tabs tablet  Take 1 tablet by mouth daily at 12 noon.        Sharen Counter Certified Nurse-Midwife 01/25/2014 8:44 PM

## 2014-01-25 NOTE — Progress Notes (Signed)
Report received from Donnamarie Poag, RN. Care assumed.

## 2014-01-25 NOTE — H&P (Signed)
LABOR ADMISSION HISTORY AND PHYSICAL  Kirby Argueta Salvino is a 23 y.o. female G13P2002 with IUP at [redacted]w[redacted]d by [redacted]w[redacted]d sono presenting with abdominal cramping after standing all day at work, noted to have severe range blood pressures in MAU. She reports +FMs, No LOF, no VB, no blurry vision, headaches or peripheral edema, and RUQ pain. She desires an epidural for labor pain control. She plans on breast feeding. She request nexplanon for birth control.  Dating: By 108w6d dating sono --->  Estimated Date of Delivery: 03/07/14   Prenatal History/Complications:  Past Medical History: Past Medical History  Diagnosis Date  . Pregnancy induced hypertension   . Chlamydia 11/2010    treated    Past Surgical History: Past Surgical History  Procedure Laterality Date  . Wisdom tooth extraction      Obstetrical History: OB History   Grav Para Term Preterm Abortions TAB SAB Ect Mult Living   0 0 0 0 0 0 2       Social History: History   Social History  . Marital Status: Single    Spouse Name: N/A    Number of Children: N/A  . Years of Education: N/A   Social History Main Topics  . Smoking status: Former Smoker -- 0.25 packs/day for 1 years    Quit date: 09/06/2010  . Smokeless tobacco: Never Used  . Alcohol Use: No  . Drug Use: No  . Sexual Activity: Yes    Birth Control/ Protection: None   Other Topics Concern  . None   Social History Narrative  . None    Family History: Family History  Problem Relation Age of Onset  . Kidney Stones Mother   . Hypertension Maternal Grandmother     Allergies: Allergies  Allergen Reactions  . Bee Venom Hives and Itching    Prescriptions prior to admission  Medication Sig Dispense Refill  . aspirin 81 MG chewable tablet Chew 162-324 mg by mouth daily as needed for headache.      . labetalol (NORMODYNE) 100 MG tablet Take 400 mg by mouth 3 (three) times daily.      . nitrofurantoin, macrocrystal-monohydrate, (MACROBID) 100 MG capsule  Take 100 mg by mouth 2 (two) times daily.      . Prenatal Vit-Fe Fumarate-FA (PRENATAL MULTIVITAMIN) TABS tablet Take 1 tablet by mouth daily at 12 noon.         Review of Systems   All systems reviewed and negative except as stated in HPI  Blood pressure 187/106, pulse 87, temperature 98 F (36.7 C), temperature source Oral, resp. rate 18, last menstrual period 06/27/2013, SpO2 100.00%, unknown if currently breastfeeding. General appearance: alert and cooperative Lungs: clear to auscultation bilaterally Heart: regular rate and rhythm Abdomen: soft, non-tender; bowel sounds normal Pelvic: finger tip by Isaias Sakai Extremities: Homans sign is negative, no sign of DVT DTR's 3+ Presentation: cephalic, confirmed with ultrasound Fetal monitoringBaseline: 130 bpm, Variability: Good {> 6 bpm), Accelerations: Reactive and Decelerations: Absent Uterine activityNone     Prenatal labs: ABO, Rh: --/--/B POS (07/13 1225) Antibody: NEG (07/13 1225) Rubella:   RPR: NON REAC (08/31 1522)  HBsAg: NEGATIVE (07/13 1225)  HIV: NONREACTIVE (08/31 1522)  GBS:    1 hr Glucola 77 Genetic screening  Too late Anatomy US normal   Clinic [facility]  Dating  23 wk ultrasound  Genetic Screen No genetic testing--too late  Anatomic [place] nml  GTT  Third trimester:  8  TDaP vaccine August 2015  Flu vaccine   GBS   Contraception Mirena  Baby Food Breast  Circumcision girl  Pediatrician [facility]  Support Person Corlis Leak (FOB)    Results for orders placed during the hospital encounter of 01/25/14 (from the past 24 hour(s))  URINALYSIS, ROUTINE W REFLEX MICROSCOPIC   Collection Time    01/25/14  6:35 PM      Result Value Ref Range   Color, Urine AMBER (*) YELLOW   APPearance CLEAR  CLEAR   Specific Gravity, Urine >1.030 (*) 1.005 - 1.030   pH 6.0  5.0 - 8.0   Glucose, UA 100 (*) NEGATIVE mg/dL   Hgb urine dipstick NEGATIVE  NEGATIVE   Bilirubin Urine SMALL (*) NEGATIVE    Ketones, ur 15 (*) NEGATIVE mg/dL   Protein, ur 30 (*) NEGATIVE mg/dL   Urobilinogen, UA 4.0 (*) 0.0 - 1.0 mg/dL   Nitrite NEGATIVE  NEGATIVE   Leukocytes, UA NEGATIVE  NEGATIVE  URINE MICROSCOPIC-ADD ON   Collection Time    01/25/14  6:35 PM      Result Value Ref Range   Squamous Epithelial / LPF FEW (*) RARE   WBC, UA 0-2  <3 WBC/hpf   Urine-Other MUCOUS PRESENT    CBC   Collection Time    01/25/14  8:20 PM      Result Value Ref Range   WBC 9.3  4.0 - 10.5 K/uL   RBC 3.47 (*) 3.87 - 5.11 MIL/uL   Hemoglobin 10.4 (*) 12.0 - 15.0 g/dL   HCT 16.1 (*) 09.6 - 04.5 %   MCV 87.0  78.0 - 100.0 fL   MCH 30.0  26.0 - 34.0 pg   MCHC 34.4  30.0 - 36.0 g/dL   RDW 40.9  81.1 - 91.4 %   Platelets 249  150 - 400 K/uL    Patient Active Problem List   Diagnosis Date Noted  . Chronic hypertension during pregnancy, antepartum 12/10/2013  . History of gestational hypertension 11/12/2013  . Supervision of high-risk pregnancy 11/12/2013    Assessment: GEORGIANA SPILLANE is a 23 y.o. G3P2002 at [redacted]w[redacted]d here for uterine irritability likely 2/2 dehydration, with chronic hypertension and superimposed preEclampsia with severe range features  #Labor: induction with cytotec, foley bulb when able, cephalic presentation confirmed by me with ultrasound #Pain: Prn fentanyl #FWB: Cat I #ID:  GBS sent, will start PCN if needed #MOF: breast #MOC:nexplanon #Circ:  ? #preE: start mag, prn labetalol and hydralazine, continue po labetalol  TID  Kiyomi Pallo ROCIO 01/25/2014, 9:10 PM

## 2014-01-26 ENCOUNTER — Encounter (HOSPITAL_COMMUNITY): Payer: Self-pay | Admitting: *Deleted

## 2014-01-26 ENCOUNTER — Inpatient Hospital Stay (HOSPITAL_COMMUNITY): Payer: Medicaid Other

## 2014-01-26 LAB — TYPE AND SCREEN
ABO/RH(D): B POS
Antibody Screen: NEGATIVE

## 2014-01-26 LAB — RAPID URINE DRUG SCREEN, HOSP PERFORMED
Amphetamines: NOT DETECTED
BARBITURATES: NOT DETECTED
BENZODIAZEPINES: NOT DETECTED
Cocaine: NOT DETECTED
Opiates: NOT DETECTED
Tetrahydrocannabinol: NOT DETECTED

## 2014-01-26 LAB — RPR

## 2014-01-26 MED ORDER — LABETALOL HCL 300 MG PO TABS
600.0000 mg | ORAL_TABLET | Freq: Three times a day (TID) | ORAL | Status: DC
  Administered 2014-01-26 – 2014-01-27 (×4): 600 mg via ORAL
  Filled 2014-01-26 (×9): qty 2

## 2014-01-26 MED ORDER — BUTALBITAL-APAP-CAFFEINE 50-325-40 MG PO TABS
1.0000 | ORAL_TABLET | ORAL | Status: DC | PRN
  Administered 2014-01-26 (×2): 1 via ORAL
  Filled 2014-01-26 (×2): qty 1

## 2014-01-26 NOTE — Progress Notes (Signed)
ARLEE BOSSARD is a 23 y.o. G3P2002 at [redacted]w[redacted]d.  Subjective: Currently comfortable without medications. Not feeling too many contractions  Objective: BP 162/100  Pulse 90  Temp(Src) 96.9 F (36.1 C) (Axillary)  Resp 18  Ht  (1.626 m)  Wt 93.441 kg (206 lb)  BMI 35.34 kg/m2  SpO2 100%  LMP 06/27/2013   FHT:  FHR: 125 bpm, variability: minimal,  accelerations:  Absent,  decelerations:  None  UC:   Q 1-23minutes, irregular  Dilation: 1.5 Effacement (%): 50 Cervical Position: Posterior Station: -3 Presentation: Vertex Exam by:: Dr. Arnette Felts  Labs: Results for orders placed during the hospital encounter of 01/25/14 (from the past 24 hour(s))  URINALYSIS, ROUTINE W REFLEX MICROSCOPIC     Status: Abnormal   Collection Time    01/25/14  6:35 PM      Result Value Ref Range   Color, Urine AMBER (*) YELLOW   APPearance CLEAR  CLEAR   Specific Gravity, Urine >1.030 (*) 1.005 - 1.030   pH 6.0  5.0 - 8.0   Glucose, UA 100 (*) NEGATIVE mg/dL   Hgb urine dipstick NEGATIVE  NEGATIVE   Bilirubin Urine SMALL (*) NEGATIVE   Ketones, ur 15 (*) NEGATIVE mg/dL   Protein, ur 30 (*) NEGATIVE mg/dL   Urobilinogen, UA 4.0 (*) 0.0 - 1.0 mg/dL   Nitrite NEGATIVE  NEGATIVE   Leukocytes, UA NEGATIVE  NEGATIVE  URINE MICROSCOPIC-ADD ON     Status: Abnormal   Collection Time    01/25/14  6:35 PM      Result Value Ref Range   Squamous Epithelial / LPF FEW (*) RARE   WBC, UA 0-2  <3 WBC/hpf   Urine-Other MUCOUS PRESENT    PROTEIN / CREATININE RATIO, URINE     Status: None   Collection Time    01/25/14  6:35 PM      Result Value Ref Range   Creatinine, Urine 410.6     Total Protein, Urine 37.7     Protein Creatinine Ratio 0.09  0.00 - 0.15  COMPREHENSIVE METABOLIC PANEL     Status: Abnormal   Collection Time    01/25/14  8:20 PM      Result Value Ref Range   Sodium 138  137 - 147 mEq/L   Potassium 3.4 (*) 3.7 - 5.3 mEq/L   Chloride 103  96 - 112 mEq/L   CO2 21  19 - 32 mEq/L   Glucose,  Bld 87  70 - 99 mg/dL   BUN 6  6 - 23 mg/dL   Creatinine, Ser 2.13  0.50 - 1.10 mg/dL   Calcium 9.1  8.4 - 08.6 mg/dL   Total Protein 6.7  6.0 - 8.3 g/dL   Albumin 3.1 (*) 3.5 - 5.2 g/dL   AST 17  0 - 37 U/L   ALT 12  0 - 35 U/L   Alkaline Phosphatase 142 (*) 39 - 117 U/L   Total Bilirubin 0.2 (*) 0.3 - 1.2 mg/dL   GFR calc non Af Amer >90  >90 mL/min   GFR calc Af Amer >90  >90 mL/min   Anion gap 14  5 - 15  CBC     Status: Abnormal   Collection Time    01/25/14  8:20 PM      Result Value Ref Range   WBC 9.3  4.0 - 10.5 K/uL   RBC 3.47 (*) 3.87 - 5.11 MIL/uL   Hemoglobin 10.4 (*) 12.0 - 15.0  g/dL   HCT 45.4 (*) 09.8 - 11.9 %   MCV 87.0  78.0 - 100.0 fL   MCH 30.0  26.0 - 34.0 pg   MCHC 34.4  30.0 - 36.0 g/dL   RDW 14.7  82.9 - 56.2 %   Platelets 249  150 - 400 K/uL  RPR     Status: None   Collection Time    01/25/14  8:20 PM      Result Value Ref Range   RPR NON REAC  NON REAC  TYPE AND SCREEN     Status: None   Collection Time    01/25/14  9:15 PM      Result Value Ref Range   ABO/RH(D) B POS     Antibody Screen NEG     Sample Expiration 01/28/2014    GROUP B STREP BY PCR     Status: None   Collection Time    01/25/14  9:30 PM      Result Value Ref Range   Group B strep by PCR NEGATIVE  NEGATIVE    Assessment / Plan: [redacted]w[redacted]d week IUP Labor: Progressing well on cytotec x 1. FB placee at 0425 Fetal Wellbeing:  Category 2, most likely 2/2 Mag Pain Control:  Fentanyl given after FB placement  Anticipated MOD:  SVD Elevated BPs: Somewhat improved, last was 142/77. Continue with PRNs. Asymptomatic.

## 2014-01-26 NOTE — Progress Notes (Signed)
Patient ID: Tara Parrish, female   DOB: Oct 06, 1990, 23 y.o.   MRN: [id number]  Seen by Dr Emelda Fear, [facility] and me  Upon looking at case, with improvement of BP trend and absence of headache or abdominal pain, it was recommended that we stop the induction of labor in light of a preterm baby.  We will continue Magnesium Sulfate for today and get an [place] and Doppler studies to verify health of placental unit and baby.  FHR has been reassuring and UCs every 2-3 minutes  Foley bulb removed.

## 2014-01-26 NOTE — Progress Notes (Signed)
Patient ID: Tara Parrish, female   DOB: 1991-04-07, 23 y.o.   MRN: [id number] Pts nurse mentioned that pt c/o HA. Pt reported feeling better after eating.  Wne to check on pt and she is sound asleep. BP 115/62  Pulse 74  Temp(Src) 98 F (36.7 C) (Oral)  Resp 18  Ht  (1.626 m)  Wt 206 lb (93.441 kg)  BMI 35.34 kg/m2  SpO2 100%  LMP 06/27/2013 Continue current care. Fioricet prn HA  Johnnie Moten L. Harraway-Smith, M.D., [facility]n Core

## 2014-01-26 NOTE — Progress Notes (Signed)
Tara Parrish is a 23 y.o. G3P2002 at [redacted]w[redacted]d by ultrasound admitted for severely elevated blood pressures concerning for preeclampsia  Subjective: Comfortable, has had multiple headaches today that have resolved with various pain medications.  Currently no headache, no visual changes, no swelling  Objective: BP 158/93  Pulse 74  Temp(Src) 97.7 F (36.5 C) (Oral)  Resp 20  Ht  (1.626 m)  Wt 206 lb (93.441 kg)  BMI 35.34 kg/m2  SpO2 100%  LMP 06/27/2013 I/O last 3 completed shifts: In: 5082.5 [P.O.:2490; I.V.:2592.5] Out: 5025 [Urine:5025] Total I/O In: 486 [P.O.:236; I.V.:250] Out: 0  Filed Vitals:   01/26/14 1801 01/26/14 1906 01/26/14 2001 01/26/14 2053  BP: 139/90 142/93 135/84 158/93  Pulse: 73 72 79 74  Temp:   97.7 F (36.5 C)   TempSrc:   Oral   Resp: Height:      Weight:      SpO2:        FHT:  FHR: 125 bpm, variability: minimal to moderate,  accelerations:  Occasionally present,  decelerations:  Absent UC:   irregular SVE:   Dilation: 1.5 Effacement (%): 70 Station: -3 Exam by:: Artelia Laroche, CNM  Labs: Lab Results  Component Value Date   WBC 9.3 01/25/2014   HGB 10.4* 01/25/2014   HCT 30.2* 01/25/2014   MCV 87.0 01/25/2014   PLT 249 01/25/2014    Assessment / Plan: SIUP at [redacted]w[redacted]d   Gestational Hypertension, not quite meeting criteria for severe preeclampsia   Labor: Not in labor Preeclampsia:  on magnesium sulfate and no signs or symptoms of toxicity Fetal Wellbeing:  Category I Pain Control:  Labor support without medications I/D:  n/a Anticipated MOD:  Continue to observe, continue Magnesium Sulfate for 24 hrs  Turner Baillie ROCIO 01/26/2014, 9:35 PM

## 2014-01-26 NOTE — Plan of Care (Signed)
Problem: Phase I Progression Outcomes Goal: Assess per MD/Nurse,Routine-VS,FHR,UC,Head to Toe assess Outcome: Progressing Pt admitted for elevated BP, HA. Has GHTN and taking home meds to help contol BP. At this time patient on Magnesium Sulfate with orders for Apresoline and Labetalol for SBP >160 DBP >100. Pt education regarding s/s of Preeclampsia, pt verbalizes understanding.

## 2014-01-26 NOTE — Progress Notes (Signed)
Tara Parrish is a 23 y.o. G3P2002 at [redacted]w[redacted]d.  Subjective: Patient sleeping comfortably.  Objective: BP 150/91  Pulse 86  Temp(Src) 96.9 F (36.1 C) (Axillary)  Resp 18  Ht  (1.626 m)  Wt 93.441 kg (206 lb)  BMI 35.34 kg/m2  SpO2 100%  LMP 06/27/2013   Did not wake her up for a physical exam  FHT:  FHR: 125 bpm, variability: minimal,  accelerations:  none,  decelerations:  None  UC:   Q  3-65minutes, irregular  Dilation: Fingertip Effacement (%): Thick Cervical Position: Posterior Station: -3 Presentation: Vertex Exam by:: Bertram Millard, RN  Labs: Results for orders placed during the hospital encounter of 01/25/14 (from the past 24 hour(s))  URINALYSIS, ROUTINE W REFLEX MICROSCOPIC     Status: Abnormal   Collection Time    01/25/14  6:35 PM      Result Value Ref Range   Color, Urine AMBER (*) YELLOW   APPearance CLEAR  CLEAR   Specific Gravity, Urine >1.030 (*) 1.005 - 1.030   pH 6.0  5.0 - 8.0   Glucose, UA 100 (*) NEGATIVE mg/dL   Hgb urine dipstick NEGATIVE  NEGATIVE   Bilirubin Urine SMALL (*) NEGATIVE   Ketones, ur 15 (*) NEGATIVE mg/dL   Protein, ur 30 (*) NEGATIVE mg/dL   Urobilinogen, UA 4.0 (*) 0.0 - 1.0 mg/dL   Nitrite NEGATIVE  NEGATIVE   Leukocytes, UA NEGATIVE  NEGATIVE  URINE MICROSCOPIC-ADD ON     Status: Abnormal   Collection Time    01/25/14  6:35 PM      Result Value Ref Range   Squamous Epithelial / LPF FEW (*) RARE   WBC, UA 0-2  <3 WBC/hpf   Urine-Other MUCOUS PRESENT    PROTEIN / CREATININE RATIO, URINE     Status: None   Collection Time    01/25/14  6:35 PM      Result Value Ref Range   Creatinine, Urine 410.6     Total Protein, Urine 37.7     Protein Creatinine Ratio 0.09  0.00 - 0.15  COMPREHENSIVE METABOLIC PANEL     Status: Abnormal   Collection Time    01/25/14  8:20 PM      Result Value Ref Range   Sodium 138  137 - 147 mEq/L   Potassium 3.4 (*) 3.7 - 5.3 mEq/L   Chloride 103  96 - 112 mEq/L   CO2 21  19 - 32 mEq/L   Glucose, Bld 87  70 - 99 mg/dL   BUN 6  6 - 23 mg/dL   Creatinine, Ser 4.54  0.50 - 1.10 mg/dL   Calcium 9.1  8.4 - 09.8 mg/dL   Total Protein 6.7  6.0 - 8.3 g/dL   Albumin 3.1 (*) 3.5 - 5.2 g/dL   AST 17  0 - 37 U/L   ALT 12  0 - 35 U/L   Alkaline Phosphatase 142 (*) 39 - 117 U/L   Total Bilirubin 0.2 (*) 0.3 - 1.2 mg/dL   GFR calc non Af Amer >90  >90 mL/min   GFR calc Af Amer >90  >90 mL/min   Anion gap 14  5 - 15  CBC     Status: Abnormal   Collection Time    01/25/14  8:20 PM      Result Value Ref Range   WBC 9.3  4.0 - 10.5 K/uL   RBC 3.47 (*) 3.87 - 5.11 MIL/uL   Hemoglobin  10.4 (*) 12.0 - 15.0 g/dL   HCT 40.9 (*) 81.1 - 91.4 %   MCV 87.0  78.0 - 100.0 fL   MCH 30.0  26.0 - 34.0 pg   MCHC 34.4  30.0 - 36.0 g/dL   RDW 78.2  95.6 - 21.3 %   Platelets 249  150 - 400 K/uL  GROUP B STREP BY PCR     Status: None   Collection Time    01/25/14  9:30 PM      Result Value Ref Range   Group B strep by PCR NEGATIVE  NEGATIVE    Assessment / Plan: [redacted]w[redacted]d week IUP Labor: Currently on 1st dose of cytotec. Will re-check and attempt FB placement at 0400 Fetal Wellbeing:  Category 2, most likely 2/2 mag Pain Control:  N/A Anticipated MOD:  SVD Elevated BPs: Pt's BPs are now somewhat improved from 180/100s to 150s/90s. Increased DTRs and +clonus per RN exam. Will continue to monitor   Joanna Puff, MD [facility] Resident

## 2014-01-26 NOTE — Progress Notes (Signed)
Patient ID: Tara Parrish, female   DOB: Jan 19, 1991, 23 y.o.   MRN: [id number]  US findings reviewed by Dr Erin Fulling.  Growth is 51%ile, AFI  Is 11.1  Placenta Anterior, cephalic presentation  Dopplers normal >97%ile.  Will continue to observe

## 2014-01-26 NOTE — H&P (Signed)
`````  Attestation of Attending Supervision of Advanced Practitioner: Evaluation and management procedures were performed by the PA/NP/CNM/OB Fellow under my supervision/collaboration. Chart reviewed and agree with management and plan.  Mikhayla Phillis V 01/26/2014 6:19 AM

## 2014-01-26 NOTE — Progress Notes (Signed)
Patient ID: Tara Parrish, female   DOB: [birth year], 23 y.o.   MRN: [id number] ACULTY PRACTICE ANTEPARTUM COMPREHENSIVE PROGRESS NOTE  Tara Parrish is a 23 y.o. G3P2002 at [redacted]w[redacted]d  who is admitted for preeclampsia.   Fetal presentation is cephalic. Length of Stay:  1  Days  Subjective: Pt denies vision changes or HA.  No RUQ pain. Patient reports good fetal movement.  She has uterine contractions on the monitor but is not feeling them.  She has some spotting but her cervical foley was removed ~2 hours prev. No loss of blood per vagina. Pt is on magnesium sulfate.  Vitals:  Blood pressure 151/96, pulse 76, temperature 97.8 F (36.6 C), temperature source Axillary, resp. rate 18, height  (1.626 m), weight 206 lb (93.441 kg), last menstrual period 06/27/2013, SpO2 100.00%, unknown if currently breastfeeding. Physical Examination: Membranes:intact  Fetal Monitoring:  Baseline: 120's bpm and Variability: on Magnesium sulfate.  No decelerations.  Labs:  Results for orders placed during the hospital encounter of 01/25/14 (from the past 24 hour(s))  URINALYSIS, ROUTINE W REFLEX MICROSCOPIC   Collection Time    01/25/14  6:35 PM      Result Value Ref Range   Color, Urine AMBER (*) YELLOW   APPearance CLEAR  CLEAR   Specific Gravity, Urine >1.030 (*) 1.005 - 1.030   pH 6.0  5.0 - 8.0   Glucose, UA 100 (*) NEGATIVE mg/dL   Hgb urine dipstick NEGATIVE  NEGATIVE   Bilirubin Urine SMALL (*) NEGATIVE   Ketones, ur 15 (*) NEGATIVE mg/dL   Protein, ur 30 (*) NEGATIVE mg/dL   Urobilinogen, UA 4.0 (*) 0.0 - 1.0 mg/dL   Nitrite NEGATIVE  NEGATIVE   Leukocytes, UA NEGATIVE  NEGATIVE  URINE MICROSCOPIC-ADD ON   Collection Time    01/25/14  6:35 PM      Result Value Ref Range   Squamous Epithelial / LPF FEW (*) RARE   WBC, UA 0-2  <3 WBC/hpf   Urine-Other MUCOUS PRESENT    PROTEIN / CREATININE RATIO, URINE   Collection Time    01/25/14  6:35 PM      Result Value Ref Range   Creatinine,  Urine 410.6     Total Protein, Urine 37.7     Protein Creatinine Ratio 0.09  0.00 - 0.15  COMPREHENSIVE METABOLIC PANEL   Collection Time    01/25/14  8:20 PM      Result Value Ref Range   Sodium 138  137 - 147 mEq/L   Potassium 3.4 (*) 3.7 - 5.3 mEq/L   Chloride 103  96 - 112 mEq/L   CO2 21  19 - 32 mEq/L   Glucose, Bld 87  70 - 99 mg/dL   BUN 6  6 - 23 mg/dL   Creatinine, Ser 4.09  0.50 - 1.10 mg/dL   Calcium 9.1  8.4 - 81.1 mg/dL   Total Protein 6.7  6.0 - 8.3 g/dL   Albumin 3.1 (*) 3.5 - 5.2 g/dL   AST 17  0 - 37 U/L   ALT 12  0 - 35 U/L   Alkaline Phosphatase 142 (*) 39 - 117 U/L   Total Bilirubin 0.2 (*) 0.3 - 1.2 mg/dL   GFR calc non Af Amer >90  >90 mL/min   GFR calc Af Amer >90  >90 mL/min   Anion gap 14  5 - 15  CBC   Collection Time    01/25/14  8:20 PM  Result Value Ref Range   WBC 9.3  4.0 - 10.5 K/uL   RBC 3.47 (*) 3.87 - 5.11 MIL/uL   Hemoglobin 10.4 (*) 12.0 - 15.0 g/dL   HCT 16.1 (*) 09.6 - 04.5 %   MCV 87.0  78.0 - 100.0 fL   MCH 30.0  26.0 - 34.0 pg   MCHC 34.4  30.0 - 36.0 g/dL   RDW 40.9  81.1 - 91.4 %   Platelets 249  150 - 400 K/uL  RPR   Collection Time    01/25/14  8:20 PM      Result Value Ref Range   RPR NON REAC  NON REAC  TYPE AND SCREEN   Collection Time    01/25/14  9:15 PM      Result Value Ref Range   ABO/RH(D) B POS     Antibody Screen NEG     Sample Expiration 01/28/2014    GROUP B STREP BY PCR   Collection Time    01/25/14  9:30 PM      Result Value Ref Range   Group B strep by PCR NEGATIVE  NEGATIVE  URINE RAPID DRUG SCREEN (HOSP PERFORMED)   Collection Time    01/26/14  7:20 AM      Result Value Ref Range   Opiates NONE DETECTED  NONE DETECTED   Cocaine NONE DETECTED  NONE DETECTED   Benzodiazepines NONE DETECTED  NONE DETECTED   Amphetamines NONE DETECTED  NONE DETECTED   Tetrahydrocannabinol NONE DETECTED  NONE DETECTED   Barbiturates NONE DETECTED  NONE DETECTED    Imaging Studies:    Preliminary report.   EFW ~50th%ile.  AFI 11.  Umb aa dopplers elevated but, good flow.  (awaiting official read)      Medications:  Scheduled . labetalol  600 mg Oral 3 times per day   I have reviewed the patient's current medications.  ASSESSMENT: Patient Active Problem List   Diagnosis Date Noted  . Chronic hypertension with superimposed preeclampsia 01/25/2014  . Chronic hypertension during pregnancy, antepartum 12/10/2013  . History of gestational hypertension 11/12/2013  . Supervision of high-risk pregnancy 11/12/2013    PLAN: Pt was admitted early this am and started on Magnesium sulfate for severe range blood pressures and cervical ripening was started.  Given pts gestational age; the fact that conservative measure were no fully utilized and pt has stabilized a joint decision was made to remove cervical foley and eval the baby more closely.  The prelim [place] report show no fetal compromise so we will hold off on IOL attempt to prolong pregnacy as long as possible hoepfully to 36-37 week if pt/fetus tolerates it.  When discussing this with mother and FOB they both agree to current plan. Will discontinue Magnesium sulfate in 24 hours. Reg diet Fetal sono with dopplers and AFI (completed) Continue routine antenatal care. Watch BP's closely Deliver for maternal or fetal indicaions   HARRAWAY-SMITH, Jobe Mutch 01/26/2014,10:42 AM

## 2014-01-26 NOTE — Progress Notes (Signed)
VIRGIE KUNDA is a 23 y.o. G3P2002 at [redacted]w[redacted]d by ultrasound admitted for Hypertension  Subjective: Has had some bloody show throughout the day. Does not feel contractions much. They have slowed throughout the day.   Objective: BP 142/93  Pulse 72  Temp(Src) 97.2 F (36.2 C) (Oral)  Resp 18  Ht  (1.626 m)  Wt 206 lb (93.441 kg)  BMI 35.34 kg/m2  SpO2 100%  LMP 06/27/2013 I/O last 3 completed shifts: In: 4842.5 [P.O.:2250; I.V.:2592.5] Out: 4425 [Urine:4425]   Filed Vitals:   01/26/14 1600 01/26/14 1701 01/26/14 1801 01/26/14 1906  BP: 146/90 113/60 139/90 142/93  Pulse: 76 80 73 72  Temp:      TempSrc:      Resp: Height:      Weight:      SpO2:        FHT:  FHR: 140 bpm, variability: moderate,  accelerations:  Present,  decelerations:  Absent UC:   irregular, every 5 minutes SVE:   Dilation: 1.5 Effacement (%): 70 Station: -3 Exam by:: Artelia Laroche, CNM  Labs: Lab Results  Component Value Date   WBC 9.3 01/25/2014   HGB 10.4* 01/25/2014   HCT 30.2* 01/25/2014   MCV 87.0 01/25/2014   PLT 249 01/25/2014    Assessment / Plan: SIUP at [redacted]w[redacted]d   Gestational Hypertension, not quite meeting criteria for severe preeclampsia Irregular contractions with no change in cervix  Labor: Not in labor Preeclampsia:  on magnesium sulfate and no signs or symptoms of toxicity Fetal Wellbeing:  Category I Pain Control:  Labor support without medications I/D:  n/a Anticipated MOD:  Continue to observe, continue Magnesium Sulfate for 24 hrs  WILLIAMS,MARIE 01/26/2014, 7:17 PM

## 2014-01-27 LAB — CREATININE CLEARANCE, URINE, 24 HOUR
COLLECTION INTERVAL-CRCL: 24 h
CREAT CLEAR: 180 mL/min — AB (ref 75–115)
CREATININE 24H UR: 1507 mg/d (ref 700–1800)
Creatinine, Urine: 76.31 mg/dL
Creatinine: 0.58 mg/dL (ref 0.50–1.10)
Urine Total Volume-CRCL: 1975 mL

## 2014-01-27 MED ORDER — NIFEDIPINE 10 MG PO CAPS
10.0000 mg | ORAL_CAPSULE | Freq: Four times a day (QID) | ORAL | Status: DC
  Administered 2014-01-28: 10 mg via ORAL
  Filled 2014-01-27: qty 1

## 2014-01-27 MED ORDER — OXYCODONE HCL 5 MG PO TABS
5.0000 mg | ORAL_TABLET | ORAL | Status: DC | PRN
  Administered 2014-01-27 (×3): 5 mg via ORAL
  Filled 2014-01-27 (×3): qty 1

## 2014-01-27 NOTE — Progress Notes (Signed)
Patient ID: Tara Parrish, female   DOB: 1990/10/20, 23 y.o.   MRN: [id number] Doing well, still having pain with contractions  Filed Vitals:   01/27/14 0200 01/27/14 0649 01/27/14 0755 01/27/14 1131  BP: 141/85 160/87 157/85 154/97  Pulse: 78 78 82 80  Temp:   98.2 F (36.8 C) 98.2 F (36.8 C)  TempSrc:   Oral Oral  Resp: Height:      Weight:      SpO2:       FHR reassuring UCs every 2-3 min  Dilation: 1.5 Effacement (%): 70 Cervical Position: Posterior Station: -3 Presentation: Vertex Exam by:: Artelia Laroche, CNM  Will continue to observe

## 2014-01-27 NOTE — Progress Notes (Signed)
Patient ID: Tara Parrish, female   DOB: Dec 03, 1990, 23 y.o.   MRN: [id number] Doing well, but having some pain with contractions Hungry, eating breakfast  FHR reassuring Irregular contractions  Still having bloody show Last Cervix exam: Dilation: 1.5 Effacement (%): 70 Cervical Position: Posterior Station: -3 Presentation: Vertex Exam by:: Artelia Laroche, CNM  Will continue 24 hr urine  GBS Negative

## 2014-01-27 NOTE — Progress Notes (Signed)
Patient ID: Tara Parrish, female   DOB: [birth year], 23 y.o.   MRN: [id number] ACULTY PRACTICE ANTEPARTUM COMPREHENSIVE PROGRESS NOTE  Tara Parrish is a 23 y.o. G3P2002 at [redacted]w[redacted]d  who is admitted for elevated blood pressures and headache.   Fetal presentation is cephalic. Length of Stay:  2  Days  Subjective: Pt reports that her HA is complete resolved at present.  She reports that she did not sleep well due to painful contractions. Patient reports good fetal movement.  She reports freq/painful uterine contractions that are not present currently, no bleeding and no loss of fluid per vagina.  Vitals:  Blood pressure 141/85, pulse 78, temperature 97.7 F (36.5 C), temperature source Oral, resp. rate 18, height  (1.626 m), weight 206 lb (93.441 kg), last menstrual period 06/27/2013, SpO2 100.00%, unknown if currently breastfeeding. Physical Examination: General appearance - alert, well appearing, and in no distress Chest - clear to auscultation, no wheezes, rales or rhonchi, symmetric air entry Heart - normal rate, regular rhythm, normal S1, S2, no murmurs, rubs, clicks or gallops Abdomen - soft, nontender, nondistended, no masses or organomegaly gravid Cervical Exam: Not evaluated.  Membranes:intact  Fetal Monitoring:  Baseline: 120's bpm, Variability: Fair (1-6 bpm), Accelerations: Non-reactive but appropriate for gestational age and Decelerations: Absent  Labs:  Results for orders placed during the hospital encounter of 01/25/14 (from the past 24 hour(s))  URINE RAPID DRUG SCREEN (HOSP PERFORMED)   Collection Time    01/26/14  7:20 AM      Result Value Ref Range   Opiates NONE DETECTED  NONE DETECTED   Cocaine NONE DETECTED  NONE DETECTED   Benzodiazepines NONE DETECTED  NONE DETECTED   Amphetamines NONE DETECTED  NONE DETECTED   Tetrahydrocannabinol NONE DETECTED  NONE DETECTED   Barbiturates NONE DETECTED  NONE DETECTED    Imaging Studies:    AFI 11  EFW >50th  percentile   Medications:  Scheduled . labetalol  600 mg Oral 3 times per day   I have reviewed the patient's current medications.  ASSESSMENT: Patient Active Problem List   Diagnosis Date Noted  . Chronic hypertension with superimposed preeclampsia 01/25/2014  . Chronic hypertension during pregnancy, antepartum 12/10/2013  . History of gestational hypertension 11/12/2013  . Supervision of high-risk pregnancy 11/12/2013    PLAN: Pt off Magnesium sulfate Keep in house Hold IV fluids. Complete 24 hour urine Labetolol  bid Continue routine antenatal care.   HARRAWAY-SMITH, Dashia Caldeira 01/27/2014,6:33 AM

## 2014-01-27 NOTE — MAU Provider Note (Signed)
Attestation of Attending Supervision of Advanced Practitioner: Evaluation and management procedures were performed by the PA/NP/CNM/OB Fellow under my supervision/collaboration. Chart reviewed and agree with management and plan.  Idell Hissong V 01/27/2014 4:42 PM

## 2014-01-28 ENCOUNTER — Encounter (HOSPITAL_COMMUNITY): Payer: Medicaid Other | Admitting: Anesthesiology

## 2014-01-28 ENCOUNTER — Inpatient Hospital Stay (HOSPITAL_COMMUNITY): Payer: Medicaid Other | Admitting: Anesthesiology

## 2014-01-28 ENCOUNTER — Encounter (HOSPITAL_COMMUNITY): Payer: Self-pay | Admitting: Anesthesiology

## 2014-01-28 DIAGNOSIS — O0990 Supervision of high risk pregnancy, unspecified, unspecified trimester: Secondary | ICD-10-CM

## 2014-01-28 DIAGNOSIS — O10019 Pre-existing essential hypertension complicating pregnancy, unspecified trimester: Secondary | ICD-10-CM

## 2014-01-28 LAB — CBC
HEMATOCRIT: 30.8 % — AB (ref 36.0–46.0)
Hemoglobin: 10.5 g/dL — ABNORMAL LOW (ref 12.0–15.0)
MCH: 29.9 pg (ref 26.0–34.0)
MCHC: 34.1 g/dL (ref 30.0–36.0)
MCV: 87.7 fL (ref 78.0–100.0)
Platelets: 262 10*3/uL (ref 150–400)
RBC: 3.51 MIL/uL — ABNORMAL LOW (ref 3.87–5.11)
RDW: 12.8 % (ref 11.5–15.5)
WBC: 16 10*3/uL — ABNORMAL HIGH (ref 4.0–10.5)

## 2014-01-28 LAB — COMPREHENSIVE METABOLIC PANEL
ALT: 10 U/L (ref 0–35)
ANION GAP: 14 (ref 5–15)
AST: 14 U/L (ref 0–37)
Albumin: 2.8 g/dL — ABNORMAL LOW (ref 3.5–5.2)
Alkaline Phosphatase: 159 U/L — ABNORMAL HIGH (ref 39–117)
BUN: 3 mg/dL — AB (ref 6–23)
CO2: 21 mEq/L (ref 19–32)
Calcium: 8.9 mg/dL (ref 8.4–10.5)
Chloride: 103 mEq/L (ref 96–112)
Creatinine, Ser: 0.49 mg/dL — ABNORMAL LOW (ref 0.50–1.10)
GFR calc non Af Amer: >90 mL/min (ref >90)
GLUCOSE: 75 mg/dL (ref 70–99)
Potassium: 3.6 mEq/L — ABNORMAL LOW (ref 3.7–5.3)
SODIUM: 138 meq/L (ref 137–147)
TOTAL PROTEIN: 6.3 g/dL (ref 6.0–8.3)
Total Bilirubin: 0.3 mg/dL (ref 0.3–1.2)

## 2014-01-28 LAB — MAGNESIUM: MAGNESIUM: 3.6 mg/dL — AB (ref 1.5–2.5)

## 2014-01-28 LAB — PROTEIN, URINE, 24 HOUR
Collection Interval-UPROT: 24 hours
Protein, 24H Urine: 533 mg/d — ABNORMAL HIGH (ref <150)
Protein, Urine: 27 mg/dL — ABNORMAL HIGH (ref 5–24)
Urine Total Volume-UPROT: 1975 mL

## 2014-01-28 LAB — RPR

## 2014-01-28 MED ORDER — ONDANSETRON HCL 4 MG/2ML IJ SOLN: 4.0000 mg | INTRAMUSCULAR | Status: DC | PRN

## 2014-01-28 MED ORDER — DEXTROSE 5 % IV SOLN: 1.5000 mg/kg | Freq: Three times a day (TID) | INTRAVENOUS | Status: DC

## 2014-01-28 MED ORDER — LIDOCAINE HCL (PF) 1 % IJ SOLN
30.0000 mL | INTRAMUSCULAR | Status: AC | PRN
  Administered 2014-01-28: 30 mL via SUBCUTANEOUS
  Filled 2014-01-28: qty 30

## 2014-01-28 MED ORDER — PRENATAL MULTIVITAMIN CH
1.0000 | ORAL_TABLET | Freq: Every day | ORAL | Status: DC
  Administered 2014-01-29 – 2014-01-30 (×2): 1 via ORAL
  Filled 2014-01-28 (×2): qty 1

## 2014-01-28 MED ORDER — ACETAMINOPHEN 325 MG PO TABS
650.0000 mg | ORAL_TABLET | Freq: Four times a day (QID) | ORAL | Status: DC | PRN
  Administered 2014-01-28 – 2014-01-29 (×2): 650 mg via ORAL
  Filled 2014-01-28 (×2): qty 2

## 2014-01-28 MED ORDER — OXYCODONE-ACETAMINOPHEN 5-325 MG PO TABS: 1.0000 | ORAL_TABLET | ORAL | Status: DC | PRN

## 2014-01-28 MED ORDER — SIMETHICONE 80 MG PO CHEW: 80.0000 mg | CHEWABLE_TABLET | ORAL | Status: DC | PRN

## 2014-01-28 MED ORDER — OXYTOCIN 40 UNITS IN LACTATED RINGERS INFUSION - SIMPLE MED
62.5000 mL/h | INTRAVENOUS | Status: DC
  Administered 2014-01-28: 62.5 mL/h via INTRAVENOUS
  Filled 2014-01-28: qty 1000

## 2014-01-28 MED ORDER — BENZOCAINE-MENTHOL 20-0.5 % EX AERO
1.0000 "application " | INHALATION_SPRAY | CUTANEOUS | Status: DC | PRN
  Filled 2014-01-28: qty 56

## 2014-01-28 MED ORDER — INFLUENZA VAC SPLIT QUAD 0.5 ML IM SUSY
0.5000 mL | PREFILLED_SYRINGE | INTRAMUSCULAR | Status: AC
  Administered 2014-01-29: 0.5 mL via INTRAMUSCULAR
  Filled 2014-01-28: qty 0.5

## 2014-01-28 MED ORDER — HYDRALAZINE HCL 20 MG/ML IJ SOLN
10.0000 mg | INTRAMUSCULAR | Status: DC | PRN
  Administered 2014-01-28: 10 mg via INTRAVENOUS
  Administered 2014-01-28: 5 mg via INTRAVENOUS
  Administered 2014-01-28 – 2014-01-31 (×4): 10 mg via INTRAVENOUS
  Filled 2014-01-28 (×4): qty 1

## 2014-01-28 MED ORDER — EPHEDRINE 5 MG/ML INJ: 10.0000 mg | INTRAVENOUS | Status: DC | PRN

## 2014-01-28 MED ORDER — LACTATED RINGERS IV SOLN: INTRAVENOUS | Status: DC

## 2014-01-28 MED ORDER — MAGNESIUM SULFATE BOLUS VIA INFUSION
4.0000 g | Freq: Once | INTRAVENOUS | Status: AC
  Administered 2014-01-28: 4 g via INTRAVENOUS
  Filled 2014-01-28: qty 500

## 2014-01-28 MED ORDER — OXYCODONE-ACETAMINOPHEN 5-325 MG PO TABS: 2.0000 | ORAL_TABLET | ORAL | Status: DC | PRN

## 2014-01-28 MED ORDER — WITCH HAZEL-GLYCERIN EX PADS: 1.0000 "application " | MEDICATED_PAD | CUTANEOUS | Status: DC | PRN

## 2014-01-28 MED ORDER — LIDOCAINE HCL (PF) 1 % IJ SOLN
INTRAMUSCULAR | Status: DC | PRN
  Administered 2014-01-28: 4 mL
  Administered 2014-01-28: 30 mL
  Administered 2014-01-28: 4 mL

## 2014-01-28 MED ORDER — ONDANSETRON HCL 4 MG/2ML IJ SOLN
4.0000 mg | Freq: Four times a day (QID) | INTRAMUSCULAR | Status: DC | PRN
Start: 2014-01-28 — End: 2014-01-28

## 2014-01-28 MED ORDER — ONDANSETRON HCL 4 MG PO TABS: 4.0000 mg | ORAL_TABLET | ORAL | Status: DC | PRN

## 2014-01-28 MED ORDER — FENTANYL 2.5 MCG/ML BUPIVACAINE 1/10 % EPIDURAL INFUSION (WH - ANES)
INTRAMUSCULAR | Status: DC | PRN
Start: 2014-01-28 — End: 2014-01-28
  Administered 2014-01-28: 14 mL/h via EPIDURAL

## 2014-01-28 MED ORDER — OXYTOCIN BOLUS FROM INFUSION
500.0000 mL | INTRAVENOUS | Status: DC
  Administered 2014-01-28: 500 mL via INTRAVENOUS

## 2014-01-28 MED ORDER — FENTANYL 2.5 MCG/ML BUPIVACAINE 1/10 % EPIDURAL INFUSION (WH - ANES)
14.0000 mL/h | INTRAMUSCULAR | Status: DC | PRN
  Administered 2014-01-28: 14 mL/h via EPIDURAL

## 2014-01-28 MED ORDER — CITRIC ACID-SODIUM CITRATE 334-500 MG/5ML PO SOLN: 30.0000 mL | ORAL | Status: DC | PRN

## 2014-01-28 MED ORDER — PHENYLEPHRINE 40 MCG/ML (10ML) SYRINGE FOR IV PUSH (FOR BLOOD PRESSURE SUPPORT)
PREFILLED_SYRINGE | INTRAVENOUS | Status: DC
Start: 2014-01-28 — End: 2014-01-28
  Filled 2014-01-28: qty 10

## 2014-01-28 MED ORDER — CLINDAMYCIN PHOSPHATE 900 MG/50ML IV SOLN: 900.0000 mg | Freq: Three times a day (TID) | INTRAVENOUS | Status: DC

## 2014-01-28 MED ORDER — LACTATED RINGERS IV SOLN
500.0000 mL | Freq: Once | INTRAVENOUS | Status: DC
  Administered 2014-01-28: 500 mL via INTRAVENOUS

## 2014-01-28 MED ORDER — ZOLPIDEM TARTRATE 5 MG PO TABS: 5.0000 mg | ORAL_TABLET | Freq: Every evening | ORAL | Status: DC | PRN

## 2014-01-28 MED ORDER — ACETAMINOPHEN 325 MG PO TABS: 650.0000 mg | ORAL_TABLET | ORAL | Status: DC | PRN

## 2014-01-28 MED ORDER — LANOLIN HYDROUS EX OINT: TOPICAL_OINTMENT | CUTANEOUS | Status: DC | PRN

## 2014-01-28 MED ORDER — LACTATED RINGERS IV SOLN: 500.0000 mL | INTRAVENOUS | Status: DC | PRN

## 2014-01-28 MED ORDER — LACTATED RINGERS IV SOLN
INTRAVENOUS | Status: DC
  Administered 2014-01-28 – 2014-01-29 (×2): via INTRAVENOUS

## 2014-01-28 MED ORDER — LABETALOL HCL 200 MG PO TABS
400.0000 mg | ORAL_TABLET | Freq: Three times a day (TID) | ORAL | Status: DC
  Administered 2014-01-28 – 2014-01-30 (×6): 400 mg via ORAL
  Filled 2014-01-28 (×7): qty 2

## 2014-01-28 MED ORDER — MAGNESIUM SULFATE 40 G IN LACTATED RINGERS - SIMPLE
2.0000 g/h | INTRAVENOUS | Status: DC
  Administered 2014-01-28: 2 g/h via INTRAVENOUS
  Filled 2014-01-28: qty 500

## 2014-01-28 MED ORDER — DIPHENHYDRAMINE HCL 25 MG PO CAPS: 25.0000 mg | ORAL_CAPSULE | Freq: Four times a day (QID) | ORAL | Status: DC | PRN

## 2014-01-28 MED ORDER — MAGNESIUM SULFATE 40 G IN LACTATED RINGERS - SIMPLE
2.0000 g/h | INTRAVENOUS | Status: DC
  Filled 2014-01-28: qty 500

## 2014-01-28 MED ORDER — GENTAMICIN SULFATE 40 MG/ML IJ SOLN
Freq: Three times a day (TID) | INTRAVENOUS | Status: DC
  Administered 2014-01-28 – 2014-01-30 (×5): via INTRAVENOUS
  Filled 2014-01-28 (×7): qty 3.5

## 2014-01-28 MED ORDER — MAGNESIUM SULFATE 40 G IN LACTATED RINGERS - SIMPLE
2.0000 g/h | INTRAVENOUS | Status: DC
  Administered 2014-01-29: 2 g/h via INTRAVENOUS
  Filled 2014-01-28 (×2): qty 500

## 2014-01-28 MED ORDER — TETANUS-DIPHTH-ACELL PERTUSSIS 5-2.5-18.5 LF-MCG/0.5 IM SUSP
0.5000 mL | Freq: Once | INTRAMUSCULAR | Status: DC
  Filled 2014-01-28: qty 0.5

## 2014-01-28 MED ORDER — IBUPROFEN 600 MG PO TABS
600.0000 mg | ORAL_TABLET | Freq: Four times a day (QID) | ORAL | Status: DC
  Administered 2014-01-28 – 2014-01-31 (×11): 600 mg via ORAL
  Filled 2014-01-28 (×12): qty 1

## 2014-01-28 MED ORDER — FENTANYL 2.5 MCG/ML BUPIVACAINE 1/10 % EPIDURAL INFUSION (WH - ANES)
INTRAMUSCULAR | Status: AC
  Administered 2014-01-28: 14 mL/h via EPIDURAL
  Filled 2014-01-28: qty 125

## 2014-01-28 MED ORDER — PHENYLEPHRINE 40 MCG/ML (10ML) SYRINGE FOR IV PUSH (FOR BLOOD PRESSURE SUPPORT): 80.0000 ug | PREFILLED_SYRINGE | INTRAVENOUS | Status: DC | PRN

## 2014-01-28 MED ORDER — DIPHENHYDRAMINE HCL 50 MG/ML IJ SOLN: 12.5000 mg | INTRAMUSCULAR | Status: DC | PRN

## 2014-01-28 MED ORDER — SENNOSIDES-DOCUSATE SODIUM 8.6-50 MG PO TABS
2.0000 | ORAL_TABLET | ORAL | Status: DC
  Administered 2014-01-29: 2 via ORAL
  Filled 2014-01-28 (×2): qty 2

## 2014-01-28 MED ORDER — DIBUCAINE 1 % RE OINT
1.0000 "application " | TOPICAL_OINTMENT | RECTAL | Status: DC | PRN
  Filled 2014-01-28: qty 28

## 2014-01-28 NOTE — Progress Notes (Signed)
Dr. Su Hilt notified of elevated BP's, last two hyrdralazine doses with no change, and infiltrated IV. Instructed to give  hydralazine IV.

## 2014-01-28 NOTE — Anesthesia Procedure Notes (Signed)
Epidural Patient location during procedure: OB Start time: 01/28/2014 8:12 AM  Staffing Anesthesiologist: Johana Hopkinson A.  Preanesthetic Checklist Completed: patient identified, site marked, surgical consent, pre-op evaluation, timeout performed, IV checked, risks and benefits discussed and monitors and equipment checked  Epidural Patient position: sitting Prep: site prepped and draped and DuraPrep Patient monitoring: continuous pulse ox and blood pressure Approach: midline Location: L4-L5 Injection technique: LOR air  Needle:  Needle type: Tuohy  Needle gauge: 17 G Needle length: 9 cm and 9 Needle insertion depth: 9 cm Catheter type: closed end flexible Catheter size: 19 Gauge Catheter at skin depth: 14 cm Test dose: negative and Other  Assessment Events: blood not aspirated, injection not painful, no injection resistance, negative IV test and no paresthesia  Additional Notes Patient identified. Risks and benefits discussed including failed block, incomplete  Pain control, post dural puncture headache, nerve damage, paralysis, blood pressure Changes, nausea, vomiting, reactions to medications-both toxic and allergic and post Partum back pain. All questions were answered. Patient expressed understanding and wished to proceed. Sterile technique was used throughout procedure. Epidural site was Dressed with sterile barrier dressing. No paresthesias, signs of intravascular injection Or signs of intrathecal spread were encountered.  Patient was more comfortable after the epidural was dosed. Please see RN's note for documentation of vital signs and FHR which are stable.

## 2014-01-28 NOTE — Progress Notes (Signed)
Post Partum Day #0  Subjective:  Tara Parrish is a 23 y.o. [id number] [redacted]w[redacted]d s/p NSVD.    I was called by nursing for repeatidly high blood pressures (170s/100s) despite being on magnesium. Apparently she had received two dose of 10 mg IV hydralazine, but was found to have IV had infiltrated and she had not gotten any of the IV magnesium.  She therefore got a new IV and was given one dose of 5 mg hydralazine IV, which did not decrease blood pressure below 160/100, and therefore she was given additional dose of 10 mg IV hydralazine. She has developed a HA as well, for which she has received 1 dose of motrin 800 mg for cramping.   She was also found to have a postpartum fever of 101 F.  Objective: Blood pressure 138/77, pulse 107, temperature 101.5 F (38.6 C), temperature source Oral, resp. rate 21, height  (1.626 m), weight 206 lb (93.441 kg), last menstrual period 06/27/2013, SpO2 97.00%, unknown if currently breastfeeding.  Physical Exam:  General: alert, cooperative and no distress Lochia:normal flow Chest: CTAB Heart: RRR no m/r/g Abdomen: +BS, soft, nontender,  Uterine Fundus: firm, belwo umbilicus DTRs: 3+ bilaterally DVT Evaluation: No evidence of DVT seen on physical exam. Extremities: 2+ edema   Recent Labs  01/25/14 2020 01/28/14 0610  HGB 10.4* 10.5*  HCT 30.2* 30.8*    Assessment/Plan:  ASSESSMENT: Tara Parrish is a 23 y.o. [id number] [redacted]w[redacted]d s/p NSVD.  #) Endometritis - as evidence by fever post partum - start clindamyacin 900 mg IV q8h, gentamyacin 1.5 mg/kg IV q 8 hours  #) Pre-eclampsia - Persistent elevation of blood pressure despite being on IV magnesium. Currently BP < 160/100 after 2 dose of IV hydralazine. - continue IV magnesium - restart oral labetalol 400 mg TID PO - hydralazine 10 mg IV q 20 min prn BP > 160/100 - Magnesium level pending.    LOS: 3 days   Elita Boone 01/28/2014, 4:32 PM

## 2014-01-28 NOTE — Plan of Care (Signed)
Problem: Consults Goal: Birthing Suites Patient Information Press F2 to bring up selections list  Outcome: Completed/Met Date Met:  01/28/14  Pt originated from [facility]. Goal: NICU Tour Outcome: Not Met (add Reason) Due to pt's current status.  Problem: Phase I Progression Outcomes Goal: Hemodynamically stable Outcome: Progressing Pt requiring IV BP med and Increase in PO BP med and additional PO BP med.

## 2014-01-28 NOTE — Progress Notes (Addendum)
Called Dr. Su Hilt and notified her of temp, and elevated BP's unchanged by multiple hydralazine doses. I also asked her for a mag level order. MD said she would be in to see the patient soon.

## 2014-01-28 NOTE — Progress Notes (Signed)
Tara Parrish is a 23 y.o. G3P2002 at [redacted]w[redacted]d by ultrasound admitted for chronic hypertension, superimposed preeclampsia.  Subjective: Pt has developed an active labor pattern and is now 6./100/-1 vertex with bulging BOW. Will transfer to L& D Will restart mag.  Objective: BP 154/98  Pulse 101  Temp(Src) 98 F (36.7 C) (Oral)  Resp 20  Ht  (1.626 m)  Wt 93.441 kg (206 lb)  BMI 35.34 kg/m2  SpO2 100%  LMP 06/27/2013 I/O last 3 completed shifts: In: 4778.9 [P.O.:2496; I.V.:2282.9] Out: 3675 [Urine:3675]    FHT:  FHR: 150's bpm, variability: moderate,  accelerations:  Present,  decelerations:  Absent UC:   regular, every 4 minutes SVE:   Dilation: 3 Effacement (%): 70 Station: -3 Exam by:: Arnell Asal RNC VE at 6 am by jvf 6/100/-1 Labs: Lab Results  Component Value Date   WBC 16.0* 01/28/2014   HGB 10.5* 01/28/2014   HCT 30.8* 01/28/2014   MCV 87.7 01/28/2014   PLT 262 01/28/2014    Assessment / Plan: spont labor. Chronic HTN with superimposed PreE  Labor: Progressing normally, will restart Magnesium Sulfate. Preeclampsia:  labs stable and will restart Mag Sulfate Fetal Wellbeing:  Category I Pain Control:  Epidural I/D:  n/a Anticipated MOD:  NSVD  Tashia Leiterman V 01/28/2014, 6:59 AM

## 2014-01-28 NOTE — Lactation Note (Signed)
This note was copied from the chart of Tara Rosabelle Halladay. Lactation Consultation Note Initial visit at 9 hours of age.  AICU NT set mom up with DEBP.  Mom is currently pumping with DEBP.  Mom reports brief experience with older 2 children and plans to pump to offer to her this baby due to NICU.  NICU book already given by AICU RN.  Discussed using preemie setting on pump and hand expression after to collect EBM.  Colostrum containers given and collected about .  When mom does hand expression her nipple tucks inward, but everts well with DEBP. Mom encouraged to pump every 3 times or 8 times in 24 hours.    Mom does not currently have WIC, but plans to and was encouraged to get appt. And try to get a DEBP if possible.  LC to follow as needed.   Patient Name: Tara Parrish [id number] Date: 01/28/2014 Reason for consult: Initial assessment;NICU baby;Infant < 6lbs   Maternal Data Has patient been taught Hand Expression?: Yes Does the patient have breastfeeding experience prior to this delivery?: Yes  Feeding    LATCH Score/Interventions                      Lactation Tools Discussed/Used Tools: Pump Breast pump type: Double-Electric Breast Pump WIC Program: No (plans to enroll with [place]) Pump Review: Setup, frequency, and cleaning;Milk Storage Initiated by:: NT and JS Date initiated:: 01/29/14   Consult Status Consult Status: Follow-up Date: 01/29/14 Follow-up type: In-patient    Tara Parrish, Arvella Merles 01/28/2014, 9:57 PM

## 2014-01-28 NOTE — Anesthesia Preprocedure Evaluation (Signed)
Anesthesia Evaluation  Patient identified by MRN, date of birth, ID band Patient awake    Reviewed: Allergy & Precautions, H&P , Patient's Chart, lab work & pertinent test results, reviewed documented beta blocker date and time   Airway Mallampati: III TM Distance: >3 FB Neck ROM: Full    Dental no notable dental hx. (+) Teeth Intact   Pulmonary former smoker,  breath sounds clear to auscultation  Pulmonary exam normal       Cardiovascular hypertension, Pt. on medications and Pt. on home beta blockers Rhythm:Regular Rate:Normal     Neuro/Psych negative neurological ROS  negative psych ROS   GI/Hepatic negative GI ROS, Neg liver ROS,   Endo/Other  Obesity  Renal/GU negative Renal ROS  negative genitourinary   Musculoskeletal negative musculoskeletal ROS (+)   Abdominal (+) + obese,   Peds  Hematology  (+) anemia ,   Anesthesia Other Findings   Reproductive/Obstetrics (+) Pregnancy                           Anesthesia Physical Anesthesia Plan  ASA: III  Anesthesia Plan: Epidural   Post-op Pain Management:    Induction:   Airway Management Planned: Natural Airway  Additional Equipment:   Intra-op Plan:   Post-operative Plan:   Informed Consent: I have reviewed the patients History and Physical, chart, labs and discussed the procedure including the risks, benefits and alternatives for the proposed anesthesia with the patient or authorized representative who has indicated his/her understanding and acceptance.     Plan Discussed with: Anesthesiologist  Anesthesia Plan Comments:         Anesthesia Quick Evaluation

## 2014-01-29 ENCOUNTER — Other Ambulatory Visit: Payer: Medicaid Other

## 2014-01-29 LAB — CBC
HCT: 29.7 % — ABNORMAL LOW (ref 36.0–46.0)
Hemoglobin: 10.2 g/dL — ABNORMAL LOW (ref 12.0–15.0)
MCH: 30.2 pg (ref 26.0–34.0)
MCHC: 34.3 g/dL (ref 30.0–36.0)
MCV: 87.9 fL (ref 78.0–100.0)
PLATELETS: 261 10*3/uL (ref 150–400)
RBC: 3.38 MIL/uL — AB (ref 3.87–5.11)
RDW: 12.8 % (ref 11.5–15.5)
WBC: 12.6 10*3/uL — ABNORMAL HIGH (ref 4.0–10.5)

## 2014-01-29 NOTE — Lactation Note (Signed)
This note was copied from the chart of Tara Parrish. Lactation Consultation Note  Initial visit made.  Mom states her first two babies would not latch.  Her goal is to pump and bottle feed with this baby.  No interest in putting baby to breast.  Mom has Providing Breastmilk for your Baby in NICU at bedside.  Breastfeeding consultation services and support information given to patient.  [facility] will [facility] today for [facility] appointment.  [facility]   Mom pumped once last night and obtained 15 mls.  Instructed on pumping every 3 hours for 15 minutes.  Encouraged to call with questions/assist.  Patient Name: Tara Ignatius SpeckingShontia Bodie [id number]'s Date: 01/29/2014 Reason for consult: Initial assessment;NICU baby   Maternal Data    Feeding    LATCH Score/Interventions                      Lactation Tools Discussed/Used WIC Program: No Pump Review: Setup, frequency, and cleaning;Milk Storage Initiated by:: RN Date initiated:: 01/27/14   Consult Status Consult Status: Follow-up Date: 01/30/14 Follow-up type: In-patient    Huston FoleyMOULDEN, Obdulia Steier S 01/29/2014, 11:46 AM

## 2014-01-29 NOTE — Progress Notes (Signed)
Post Partum Day 1 Subjective:  Tara Parrish is a 23 y.o. [id number] 4011w4d s/p NSVD.  No acute events overnight.  Pt denies problems with ambulating, voiding or po intake.  She denies nausea or vomiting.  Pain is well controlled.  She has not had flatus. She has not had bowel movement.  Lochia Moderate, states last night she passed several clots, but has had decrease in bleeding since. Plan for birth control is IUD.  Method of Feeding: Pumped breastmilk.  Denies HA, scotoma, N/V, dizziness, LE edema, SOB.  Objective: Blood pressure 133/80, pulse 80, temperature 98 F (36.7 C), temperature source Oral, resp. rate 18, height 5\' 4"  (1.626 m), weight 196 lb 9.6 oz (89.177 kg), last menstrual period 06/27/2013, SpO2 100.00%, unknown if currently breastfeeding.  Physical Exam:  General: alert, cooperative and no distress Lochia:normal flow Chest: CTAB, no w/r/r Heart: RRR no m/r/g Abdomen: +BS, soft, nontender,  Uterine Fundus: firm, below uimbilicus DVT Evaluation: No evidence of DVT seen on physical exam. DTRs 1+ bilaterally Extremities: trace edema   Recent Labs  01/28/14 0610 01/29/14 0604  HGB 10.5* 10.2*  HCT 30.8* 29.7*   I/O last 3 completed shifts: In: 5444.8 [P.O.:2435; I.V.:2900.8; IV Piggyback:109] Out: 5050 [Urine:4600; Blood:450]     Assessment/Plan:  ASSESSMENT: Tara Parrish is a 23 y.o. [id number] 3311w4d s/p NSVD  Plan for discharge tomorrow, Breastfeeding and Contraception Mirena  #) cHTN w/ superimposed pre-eclampsia - BP better controlled in 140s./90s. UOP adequate. - Continue mag for 24 hours (up on 9/29 @ 1100) - Labetalol 400 mg TID  #) Endometritis - Had fever to 101.5 after delivery - On clinda and gent, to continue until the patient is clinically improved and afebrile for 24 to 48 hours. (9/29 - 9/30 in PM)    LOS: 4 days   Tara BooneRoberts, Tara Parrish C 01/29/2014, 8:35 AM

## 2014-01-29 NOTE — Anesthesia Postprocedure Evaluation (Signed)
Anesthesia Post Note  Patient: Tara Parrish  Procedure(s) Performed: * No procedures listed *  Anesthesia type: Epidural  Patient location: Mother/Baby  Post pain: Pain level controlled  Post assessment: Post-op Vital signs reviewed  Last Vitals:  Filed Vitals:   01/29/14 0800  BP: 148/95  Pulse: 81  Temp: 36.7 C  Resp: 18    Post vital signs: Reviewed  Level of consciousness:alert  Complications: No apparent anesthesia complications

## 2014-01-30 MED ORDER — HYDROCHLOROTHIAZIDE 25 MG PO TABS
25.0000 mg | ORAL_TABLET | Freq: Every day | ORAL | Status: DC
  Administered 2014-01-30: 25 mg via ORAL
  Filled 2014-01-30: qty 1

## 2014-01-30 MED ORDER — AMLODIPINE BESYLATE 10 MG PO TABS
10.0000 mg | ORAL_TABLET | Freq: Every day | ORAL | Status: DC
  Administered 2014-01-30 – 2014-01-31 (×2): 10 mg via ORAL
  Filled 2014-01-30 (×3): qty 1

## 2014-01-30 MED ORDER — HYDROCHLOROTHIAZIDE 25 MG PO TABS
25.0000 mg | ORAL_TABLET | Freq: Every day | ORAL | Status: DC
  Filled 2014-01-30: qty 1

## 2014-01-30 NOTE — Progress Notes (Signed)
Clinical Social Work Department PSYCHOSOCIAL ASSESSMENT - MATERNAL/CHILD 01/30/2014  Patient:  Tara Parrish, Tara Parrish  Account Number:  [id number]  Admit Date:  01/25/2014  Ardine Eng Name:   Volney American    Clinical Social Worker:  Terri Piedra, LCSW   Date/Time:  01/30/2014 10:30 AM  Date Referred:        Other referral source:   No referral-NICU admission    I:  FAMILY / [place] legal guardian:  Car[place] - Name Guardian - Age Guardian - Address  Huron Bazaldua [address], [place], [place] [address]  Corinna Gab 23 same   Other household support members/support persons Name Relationship DOB  Tash'Shon SON 12/2008  Carter Kitten 01/2011   Other support:   [name] states her "whole, entire family" is suppotive.  She and FOB are in a relationship and he is involved and supportive.  She states her mother, grandmother and sister are her other main support people.    II  PSYCHOSOCIAL DATA Information Source:  Patient Interview  Occupational hygienist Employment:   [name] works at [employer] on [address]. [place]  She states her boss has secured her job for whenever she can return, but her final paycheck is today until she is able to return.  FOB has recently been hired at [employer].  His first day was supposed to   Financial resources:  Medicaid If [place] Other  [place] / Grade:   Maternity Care Coordinator / Child Services Coordination / Early Interventions:   [place]  Cultural issues impacting care:   None stated    III  STRENGTHS Strengths  Adequate Resources  Compliance with medical plan  Home prepared for Child (including basic supplies)  Other - See comment  Supportive family/friends  Understanding of illness   Strength comment:  [name] has the main items for baby at home, but stated that she still needs to get items such as diapers, wipes, and bath supplies.  CSW asked her to continue with preparations, but offered  to provide her with some baby basic items from [employer].  She was very Patent attorney.  She states pediatric follow up will be at [facility].   IV  RISK FACTORS AND CURRENT PROBLEMS Current Problem:  None   Risk Factor & Current Problem Patient Issue Family Issue Risk Factor / Current Problem Comment   N N     V  SOCIAL WORK ASSESSMENT  CSW met with [name] in her 3rd floor room/310 to introduce myself, complete assessment due to baby's admission to NICU at 34.4 weeks and to offer support.  [name] was very welcoming of CSW intervention.  She was immediately open and talkative.  She was tearful throughout most of the conversation, which lasted approximately an hour and a half.  She describes herself as "an emotional person," but denies any hx or current symptoms of anxiety or depression.  CSW encouraged her to allow herself to be emotional and explained that it is healing to tell her story and process her feelings.  [name] talked about her pregnancy and what led to her daughter's birth.  We discussed the loss of expectations of what she thought her birth story would be (expectation that this experience would be like her last two births), and the sadness she feels over leaving her daughter in the hospital when she is discharged, most likely tomorrow.  She states she has woken up a few  times and felt like she didn't know where she was and didn't know why she couldn't feel her baby moving inside of her.  CSW validated [name]'s feelings and talked about coping strategies.  CSW helped [name] frame her thinking to hopefully set her up for the best possible emotional outcome.  CSW asked [name] to try not to have any expectations regarding baby's discharge time, but to rather enjoy each day with baby, bonding with her and watching her progress.  CSW explained that if she has expectations for baby's discharge, that baby does not meet, [name] will continually be let down.  If she focuses on her baby, rather than  when her baby will come home, discharge will be a happy occasion instead of a frustration.  CSW explained that her due date is just an estimate, and that there is no way of knowing how long it will take at this time.  [name] stated understanding and seemed to benefit greatly from this way of thinking about the situation.  She states she still has some preparing to do, so this will be a good time to get everything ready for baby.  CSW agreed and commended her for looking at the situation this way.  CSW talked about common emotions related to the NICU experience/normal emotions during the post partum period, as well as signs and symptoms of PPD to watch for.  CSW discussed that PPD is common and normal, but that it becomes concerning when a mother has symptoms that she does not address/talk to a professional about.  [name] states understanding and commits to talking with CSW and or her doctor.  She denies hx after her last two births.  [name] was open about triggers for emotional stress, such as being alone, and states her children help pull her out of sad times.  She states her when her 22 year old can tell that she is sad, she finds a way to improve her mood because she does not want him to see her sad.  She states when her 22 year old climbs in her lap and wraps her arms around him, she can't help but feel happy.  She states she and FOB have been together since her 22 year old was 48 months old.  She states this is his first baby, but he is a father to her sons.  She states this is the first time she has been in a healthy, loving relationship.  She told CSW about the father's of her first two children.  She reports that the father of her 40 year old was 67 (as she was) when he was born and was forced to move to [place] with his father shortly after the baby was born because his mother was killed in a car accident.  She states he has not been very involved in their son's life.  She states she was not in a committed relationship with  the father of her 51 year old and he was never involved.  It is clear that she is very thankful for the relationship with FOB.  [name] states neither of them have a car and that they use the bus to get places.  She states her sister has a car and will be able to help transport her to the hospital and will be able to bring breast milk in for the baby.  CSW offered bus passes and [name] accepted and was appreciative.  She also requested letters for her employer and FOB's verifying baby's premature  birth and admission to NICU.  CSW will write letters.  CSW explained ongoing support services offered by NICU CSW and asked [name] to call any time.  [name] seemed genuinely appreciative of CSW's visit and stated that she felt much better after talking with CSW.  CSW thanked [name] for sharing her story and time with CSW.     VI SOCIAL WORK PLAN Social Work Plan  Psychosocial Support/Ongoing Assessment of Needs  Patient/Family Education   Type of pt/family education:   PPD signs and symptoms/common emotions related to the NICU experience.  Ongoing support services offered by NICU CSW.   If child protective services report - county:   If child protective services report - date:   Information/referral to community resources comment:   No referral needs noted at this time.   Other social work plan:

## 2014-01-30 NOTE — Progress Notes (Signed)
Post Partum Day 2 Subjective:  Tara Parrish is a 23 y.o. [id number]  s/p NSVD at 32109w4d in the setting of severe preeclampsia superimposed on CHTN.  Also had endometritis.  No acute events overnight.  Pt denies problems with ambulating, voiding or po intake.  She denies nausea or vomiting.  Pain is well controlled.  Lochia Moderate.  Plan for birth control is Nexplanon.  Method of Feeding: Pumped breastmilk.  Denies HA, scotoma, N/V, dizziness, LE edema, SOB.  Objective: Blood pressure 154/88, pulse 73, temperature 98.6 F (37 C), temperature source Oral, resp. rate 16, height 5\' 4"  (1.626 m), weight 199 lb (90.266 kg), last menstrual period 06/27/2013, SpO2 100.00%, unknown if currently breastfeeding.  Physical Exam:  General: alert, cooperative and no distress Lochia:normal flow Abdomen: +BS, soft, nontender,  Uterine Fundus: firm, below umbilicus DVT Evaluation: No evidence of DVT seen on physical exam. DTRs 1+ bilaterally Extremities: trace edema   Recent Labs  01/28/14 0610 01/29/14 0604  HGB 10.5* 10.2*  HCT 30.8* 29.7*   I/O last 3 completed shifts: In: 4044.6 [P.O.:1880; I.V.:2164.6] Out: 2990 [Urine:2700; Blood:290]    Assessment/Plan:  ASSESSMENT: Tara Parrish is a 23 y.o. [id number] 33109w4d s/p NSVD  #) cHTN w/ superimposed pre-eclampsia - SBP still in the 150s.  Medication changed to Norvasc and HCTZ. Will continue inpatient observation and titration of antihypertensive regimen as needed.  #) Endometritis - Had fever to 101.5 after delivery; afebrile for 48 hours.  Antibiotics discontinued.   Continue routine postpartum care.  If BP stable, will discharge tomorrow.    LOS: 5 days    Tereso NewcomerUgonna A Tava Peery, MD 01/30/2014, 9:36 AM

## 2014-01-30 NOTE — Lactation Note (Signed)
This note was copied from the chart of Tara Parrish. Lactation Consultation Note  Follow up visit made.  Mom is pumping every 3 hours and obtaining 15-30 mls.  She has an appointment with [facility] on 02/01/14.  Mom is interested in a loaner pump tomorrow at discharge.  Mom states she is feeling sad because baby is in NICU.  We discussed that these feelings are normal because a preterm delivery/NICU admission was not what she planned or anticipated.  Encouraged to verbalize her feelings with social worker today.  Encouraged to call with concerns prn.  Patient Name: Tara Parrish ZOXWR'UToday's Date: 01/30/2014     Maternal Data    Feeding Feeding Type: Breast Milk with Formula added Length of feed: 30 min  LATCH Score/Interventions                      Lactation Tools Discussed/Used     Consult Status      Huston FoleyMOULDEN, Jordan Caraveo S 01/30/2014, 10:49 AM

## 2014-01-31 ENCOUNTER — Other Ambulatory Visit: Payer: Medicaid Other

## 2014-01-31 MED ORDER — HYDROCHLOROTHIAZIDE 50 MG PO TABS: 50.0000 mg | ORAL_TABLET | Freq: Every day | ORAL | Status: DC

## 2014-01-31 MED ORDER — IBUPROFEN 600 MG PO TABS: 600.0000 mg | ORAL_TABLET | Freq: Four times a day (QID) | ORAL | Status: DC

## 2014-01-31 MED ORDER — HYDROCHLOROTHIAZIDE 50 MG PO TABS
50.0000 mg | ORAL_TABLET | Freq: Every day | ORAL | Status: DC
  Administered 2014-01-31: 50 mg via ORAL
  Filled 2014-01-31 (×2): qty 1

## 2014-01-31 MED ORDER — AMLODIPINE BESYLATE 10 MG PO TABS: 10.0000 mg | ORAL_TABLET | Freq: Every day | ORAL | Status: DC

## 2014-01-31 NOTE — Progress Notes (Signed)
Pt is discharged in the care in the father. Discharged instructions were given to pt with good understanding.. Questions were asked and answered. Stable. Infant  to remain in nicu.Denies pain or heavy vaginal bleeding,

## 2014-01-31 NOTE — Progress Notes (Signed)
Dr. Marice Potterove notified of pt.'s b/p systolic 160-180's diastolic 95-102. No c/o PIH symptoms. Orders given to give the hydralazine 10 mg IV as previously ordered. Will continue to monitor.

## 2014-01-31 NOTE — Discharge Instructions (Signed)
Breast Pumping Tips °If you are breastfeeding, there may be times when you cannot feed your baby directly. Returning to work or going on a trip are examples. Pumping allows you to store breast milk and feed it to your baby later.  °You may not get much milk when you first start to pump. Your breasts should start to make more after a few days. If you pump at the times you usually feed your baby, you may be able to keep making enough milk to feed your baby without also using formula. The more often you pump, the more milk your body will make. °WHEN SHOULD I PUMP?  °· You can start to pump soon after you have your baby. Ask your doctor what is right for you and your baby. °· If you are going back to work, start pumping a few weeks before. This gives you time to learn how to pump and to store a supply of milk. °· When you are with your baby, feed your baby when he or she is hungry. Pump after each feeding. °· When you are away from your baby for many hours, pump for about 15 minutes every 2-3 hours. Pump both breasts at the same time if you can. °· If your baby has a formula feeding, make sure to pump close to the same time. °· If you drink any alcohol, wait 2 hours before pumping. °HOW DO I GET READY TO PUMP? °Your let-down reflex is your body's natural reaction that makes your breast milk flow. It is easier to make your breast milk flow when you are relaxed. Try these things to help you relax: °· Smell one of your baby's blankets or an item of clothing. °· Look at a picture or video of your baby. °· Sit in a quiet, private space. °· Massage the breast you plan to pump. °· Place soothing warmth on the breast. °· Play relaxing music. °WHAT ARE SOME BREAST PUMPING TIPS? °· Wash your hands before you pump. You do not need to wash your nipples or breasts. °· There are three ways to pump. You can: °¨ Use your hand to massage and squeeze your breast. °¨ Use a handheld manual pump. °¨ Use an electric pump. °· Make sure the  suction cup on the breast pump is the right size. Place the suction cup directly over the nipple. It can be painful or hurt your nipple if it is the wrong size or placed wrong. °· Put a small amount of purified or modified lanolin on your nipple and areola if you are sore. °· If you are using an electric pump, change the speed and suction power to be more comfortable. °· You may need a different type of pump if pumping hurts or you do not get a lot of milk. Your doctor can help you pick what type of pump to use. °· Keep a full water bottle near you always. Drinking lots of fluid helps you make more milk. °· You can store your milk to use later. Pumped breast milk can be stored in a sealable, sterile container or plastic bag. Always put the date you pumped it on the container. °¨ Milk can stay out at room temperature for up to 8 hours. °¨ You can store your milk in the refrigerator for up to 8 days. °¨ You can store your milk in the freezer for 3 months. Thaw frozen milk using warm water. Do not put it in the microwave. °· Do not smoke.   Ask your doctor for help. °WHEN SHOULD I CALL MY DOCTOR? °· You have a hard time pumping. °· You are worried you do not make enough milk. °· You have nipple pain, soreness, or redness. °· You want to take birth control pills. °Document Released: 10/06/2007 Document Revised: 04/24/2013 Document Reviewed: 02/09/2013 °ExitCare® Patient Information ©2015 ExitCare, LLC. This information is not intended to replace advice given to you by your health care provider. Make sure you discuss any questions you have with your health care provider. ° °

## 2014-01-31 NOTE — Discharge Summary (Signed)
Obstetric Discharge Summary Reason for Admission: severe preeclampsia at [redacted] weeks EGA Prenatal Procedures: NST, Preeclampsia and ultrasound Intrapartum Procedures: spontaneous vaginal delivery Postpartum Procedures: none Complications-Operative and Postpartum: none HGB  Date Value Ref Range Status  09/29/2011 10.9* 11.6 - 15.9 g/dL Final     Hemoglobin  Date Value Ref Range Status  01/29/2014 10.2* 12.0 - 15.0 g/dL Final     HCT  Date Value Ref Range Status  01/29/2014 29.7* 36.0 - 46.0 % Final  09/29/2011 32.9* 34.8 - 46.6 % Final    Physical Exam:  General: alert Lochia: appropriate Uterine Fundus: firm Incision: n/a DVT Evaluation: No evidence of DVT seen on physical exam.  Discharge Diagnoses: Preelampsia  Discharge Information: Date: 01/31/2014 Activity: pelvic rest Diet: routine Medications: PNV, Ibuprofen and Percocet Condition: stable Instructions: refer to practice specific booklet Discharge to: home Follow-up Information   [facility] Schedule an appointment as soon as possible for a visit in 1 week. (for BP check)    Contact information:   [address] [place] [address] [phone]      Newborn Data: Live born female  Birth Weight: 4 lb 13.4 oz (2194 g) APGAR: 8, 8  Daughter in NICU  Jalik Gellatly C. 01/31/2014, 6:44 AM

## 2014-01-31 NOTE — Lactation Note (Signed)
This note was copied from the chart of Tara Ashiya Repetto. Lactation Consultation Note  Follow up visit prior to maternal discharge.  Mom is currently pumping 60 mls from each breast.  [facility]  She plans on using a manual pump until then.  Encouraged to call with concerns/questions.  Mom knows to bring her pumping pieces with her when coming to NICU.  Patient Name: Tara Ignatius SpeckingShontia Deleon ZOXWR'UToday's Date: 01/31/2014     Maternal Data    Feeding Feeding Type: Breast Milk Length of feed: 30 min  LATCH Score/Interventions                      Lactation Tools Discussed/Used     Consult Status      Huston FoleyMOULDEN, Kaemon Barnett S 01/31/2014, 11:29 AM

## 2014-02-05 ENCOUNTER — Ambulatory Visit: Payer: Self-pay

## 2014-02-05 ENCOUNTER — Other Ambulatory Visit: Payer: Medicaid Other

## 2014-02-05 NOTE — Lactation Note (Signed)
This note was copied from the chart of Tara Parrish. Lactation Consultation Note     Follow up consult with this mom of a NICU baby, now 458 days old and 35 5/7 weeks CGA. The baby weighs 4 lbs 7.4 ounces. She is partially bottle feeding. I assisted mom with latching karter for the first time, but she latched a couple of times, but wotul not suck, and then was sleepy. Mom has inverted nipples, but just the middle of the nipple inverts, otherwise they evert. They are large for the baby, but she has a wide mouth, but again, she was sleepy, and would not suck on the shield, even when filled with EBM. I tried everting mom's nipple with a hand pump, which helped a little, . Mom continued with STS, and baby was fed ng. I told mom we will keep trying, and that as Collene MaresKarter grows, and bottle feeds better, she should do better with latching and breast feeding. Mom aware we  can transition baby to breast feeding in o/p lactation also.  Patient Name: Tara Ignatius SpeckingShontia Cooprider ZHYQM'VToday's Date: 02/05/2014 Reason for consult: Follow-up assessment;NICU baby;Late preterm infant   Maternal Data    Feeding Feeding Type: Breast Fed Nipple Type: Slow - flow Length of feed: 30 min  LATCH Score/Interventions Latch: Too sleepy or reluctant, no latch achieved, no sucking elicited.  Audible Swallowing: None  Type of Nipple: Inverted Intervention(s): Hand pump  Comfort (Breast/Nipple): Filling, red/small blisters or bruises, mild/mod discomfort  Problem noted: Filling Interventions (Filling): Hand pump  Hold (Positioning): Assistance needed to correctly position infant at breast and maintain latch. Intervention(s): Breastfeeding basics reviewed;Support Pillows;Position options;Skin to skin  LATCH Score: 2  Lactation Tools Discussed/Used     Consult Status Consult Status: PRN Follow-up type: In-patient (NICU)    Alfred LevinsLee, [name] 02/05/2014, 2:53 PM

## 2014-02-07 ENCOUNTER — Other Ambulatory Visit: Payer: Medicaid Other

## 2014-02-14 ENCOUNTER — Other Ambulatory Visit: Payer: Medicaid Other

## 2014-02-20 NOTE — Progress Notes (Signed)
NST 9/22 non reactive, BPP 8/8

## 2014-03-04 ENCOUNTER — Encounter (HOSPITAL_COMMUNITY): Payer: Self-pay | Admitting: Anesthesiology

## 2014-03-14 ENCOUNTER — Ambulatory Visit: Payer: Medicaid Other | Admitting: Obstetrics and Gynecology

## 2014-03-15 ENCOUNTER — Encounter: Payer: Self-pay | Admitting: Obstetrics & Gynecology

## 2014-03-15 ENCOUNTER — Ambulatory Visit (INDEPENDENT_AMBULATORY_CARE_PROVIDER_SITE_OTHER): Payer: Medicaid Other | Admitting: Obstetrics & Gynecology

## 2014-03-15 DIAGNOSIS — Z30430 Encounter for insertion of intrauterine contraceptive device: Secondary | ICD-10-CM

## 2014-03-15 DIAGNOSIS — Z30017 Encounter for initial prescription of implantable subdermal contraceptive: Secondary | ICD-10-CM

## 2014-03-15 DIAGNOSIS — I10 Essential (primary) hypertension: Secondary | ICD-10-CM

## 2014-03-15 DIAGNOSIS — O1414 Severe pre-eclampsia complicating childbirth: Secondary | ICD-10-CM

## 2014-03-15 HISTORY — DX: Severe pre-eclampsia complicating childbirth: O14.14

## 2014-03-15 LAB — POCT PREGNANCY, URINE: PREG TEST UR: NEGATIVE

## 2014-03-15 MED ORDER — ETONOGESTREL 68 MG ~~LOC~~ IMPL
68.0000 mg | DRUG_IMPLANT | Freq: Once | SUBCUTANEOUS | Status: AC
  Administered 2014-03-15: 68 mg via SUBCUTANEOUS

## 2014-03-15 MED ORDER — AMLODIPINE BESYLATE 10 MG PO TABS: 10.0000 mg | ORAL_TABLET | Freq: Every day | ORAL | Status: DC

## 2014-03-15 NOTE — Patient Instructions (Signed)
Place postpartum visit patient instructions here. Hypertension Hypertension, commonly called high blood pressure, is when the force of blood pumping through your arteries is too strong. Your arteries are the blood vessels that carry blood from your heart throughout your body. A blood pressure reading consists of a higher number over a lower number, such as 110/72. The higher number (systolic) is the pressure inside your arteries when your heart pumps. The lower number (diastolic) is the pressure inside your arteries when your heart relaxes. Ideally you want your blood pressure below 120/80. Hypertension forces your heart to work harder to pump blood. Your arteries may become narrow or stiff. Having hypertension puts you at risk for heart disease, stroke, and other problems.  RISK FACTORS Some risk factors for high blood pressure are controllable. Others are not.  Risk factors you cannot control include:   Race. You may be at higher risk if you are African American.  Age. Risk increases with age.  Gender. Men are at higher risk than women before age 22 years. After age 22, women are at higher risk than men. Risk factors you can control include:  Not getting enough exercise or physical activity.  Being overweight.  Getting too much fat, sugar, calories, or salt in your diet.  Drinking too much alcohol. SIGNS AND SYMPTOMS Hypertension does not usually cause signs or symptoms. Extremely high blood pressure (hypertensive crisis) may cause headache, anxiety, shortness of breath, and nosebleed. DIAGNOSIS  To check if you have hypertension, your health care provider will measure your blood pressure while you are seated, with your arm held at the level of your heart. It should be measured at least twice using the same arm. Certain conditions can cause a difference in blood pressure between your right and left arms. A blood pressure reading that is higher than normal on one occasion does not mean  that you need treatment. If one blood pressure reading is high, ask your health care provider about having it checked again. TREATMENT  Treating high blood pressure includes making lifestyle changes and possibly taking medicine. Living a healthy lifestyle can help lower high blood pressure. You may need to change some of your habits. Lifestyle changes may include:  Following the DASH diet. This diet is high in fruits, vegetables, and whole grains. It is low in salt, red meat, and added sugars.  Getting at least 2 hours of brisk physical activity every week.  Losing weight if necessary.  Not smoking.  Limiting alcoholic beverages.  Learning ways to reduce stress. If lifestyle changes are not enough to get your blood pressure under control, your health care provider may prescribe medicine. You may need to take more than one. Work closely with your health care provider to understand the risks and benefits. HOME CARE INSTRUCTIONS  Have your blood pressure rechecked as directed by your health care provider.   Take medicines only as directed by your health care provider. Follow the directions carefully. Blood pressure medicines must be taken as prescribed. The medicine does not work as well when you skip doses. Skipping doses also puts you at risk for problems.   Do not smoke.   Monitor your blood pressure at home as directed by your health care provider. SEEK MEDICAL CARE IF:   You think you are having a reaction to medicines taken.  You have recurrent headaches or feel dizzy.  You have swelling in your ankles.  You have trouble with your vision. SEEK IMMEDIATE MEDICAL CARE IF:  You  develop a severe headache or confusion.  You have unusual weakness, numbness, or feel faint.  You have severe chest or abdominal pain.  You vomit repeatedly.  You have trouble breathing. MAKE SURE YOU:   Understand these instructions.  Will watch your condition.  Will get help right  away if you are not doing well or get worse. Document Released: 04/19/2005 Document Revised: 09/03/2013 Document Reviewed: 02/09/2013 [facility] Patient Information 2015 [employer] [place]. This information is not intended to replace advice given to you by your health care provider. Make sure you discuss any questions you have with your health care provider.

## 2014-03-15 NOTE — Progress Notes (Signed)
Patient here today for PP visit. BP elevated X2. Reports occasional headaches, denies any abdominal pain, blurred/vision changes. Desires birth control today-- undecided on which method.

## 2014-03-15 NOTE — Progress Notes (Signed)
    Subjective:     Tara Parrish is a 23 y.o. [phone] female who presents for a postpartum visit. She is 6 weeks postpartum following a spontaneous vaginal delivery. I have fully reviewed the prenatal and intrapartum course. The [facility] at 34 gestational weeks [facility] with superimposed severe preecl[facility]. Anesthesia: epidural. Postpartum course has been complicated by persistent high BP; she stopped taking her prescribed BP meds after delivery.  Reports occasional headaches, no blurred vision or abdominal pain.  Baby's course has been uncomplicated. Baby is feeding by formula. Bleeding no bleeding. Bowel function is normal. Bladder function is normal. Patient is sexually active, had one episode of protected IC, no problems during this encounter. Contraception method is Nexplanon, wants it placed today Postpartum depression screening: negative.  The following portions of the patient's history were reviewed and updated as appropriate: allergies, current medications, past family history, past medical history, past social history, past surgical history and problem list.  Normal pap on 12/10/2013.  Review of Systems Pertinent items are noted in HPI.   Objective:    BP 156/91 mmHg  Pulse 74  Temp(Src) 98.3 F (36.8 C) (Oral)  Ht 5\' 5"  (1.651 m)  Wt 215 lb 8 oz (97.75 kg)  BMI 35.86 kg/m2  Breastfeeding? No  General:  alert and no distress   Breasts:  negative  Lungs: clear to auscultation bilaterally  Heart:  regular rate and rhythm  Abdomen: soft, non-tender; bowel sounds normal; no masses,  no organomegaly  Pelvic:  deferred   Nexplanon Insertion Procedure Patient was given informed consent, she signed consent form.  Patient does understand that irregular bleeding is a very common side effect of this medication. She was advised to have backup contraception for one week after placement. Pregnancy test in clinic today was negative.  Appropriate time out taken.  Patient's left  arm was prepped and draped in the usual sterile fashion.. The ruler used to measure and mark insertion area.  Patient was prepped with alcohol swab and then injected with 3 ml of 1% lidocaine.  She was prepped with betadine, Nexplanon removed from packaging,  Device confirmed in needle, then inserted full length of needle and withdrawn per handbook instructions. Nexplanon was able to palpated in the patient's arm; patient palpated the insert herself. There was minimal blood loss.  Patient insertion site covered with guaze and a pressure bandage to reduce any bruising.  The patient tolerated the procedure well and was given post procedure instructions.   Procedure performed by Dr. Saralyn PilarAlexander Karamalegos, Family Medicine PGY-2 under my supervision   Assessment:   Normal postpartum exam except elevated BP in setting of chronic hypertension Nexplanon placed today   Plan:   1. Contraception: Nexplanon placed today 2. Norvasc 10 mg po qd restarted for HTN; patient to follow up with PCP 3. Follow up as needed.    Jaynie CollinsUGONNA  Djimon Lundstrom, MD, FACOG Attending Obstetrician & Gynecologist Center [facility], [facility]

## 2014-04-19 ENCOUNTER — Encounter: Payer: Self-pay | Admitting: *Deleted

## 2015-01-23 IMAGING — US US OB FOLLOW-UP
1 series · 12 of 28 positions shown · non-contrast
Comparison: none

[Series 1: us ob follow up · 59 acquisitions, 12 frames shown]
[im 3/59]
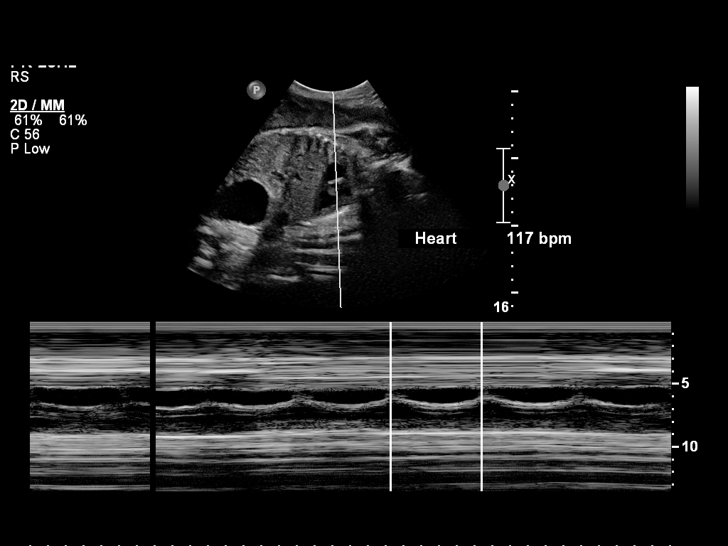
[im 7/59]
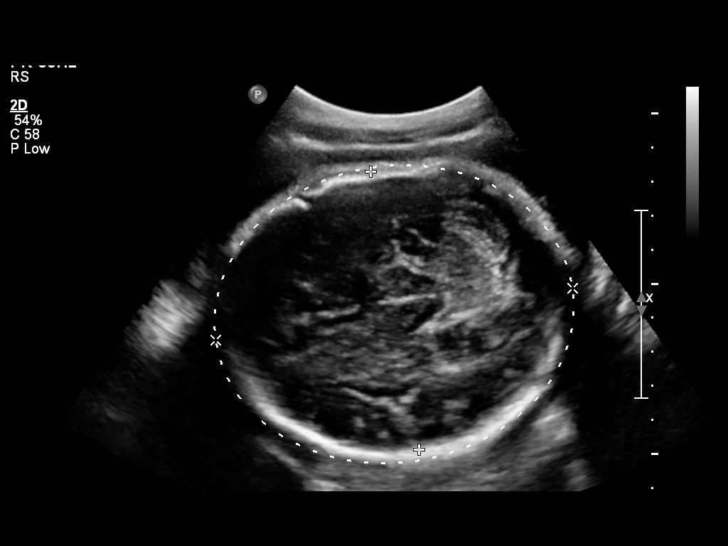
[im 11/59]
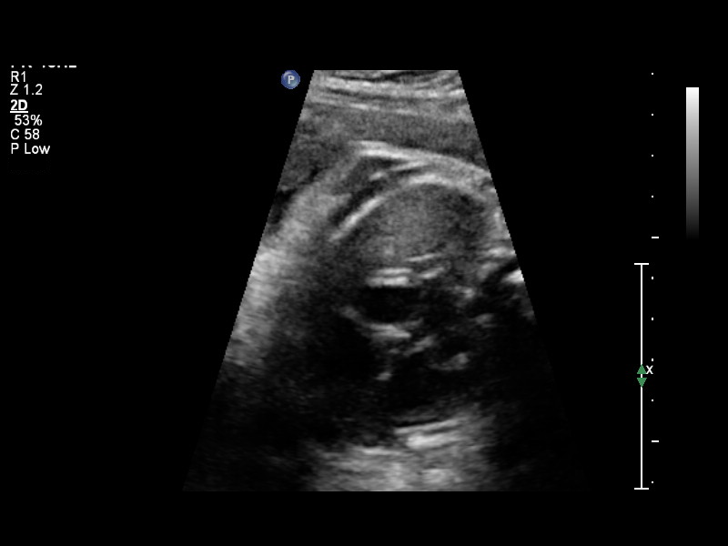
[im 18/59]
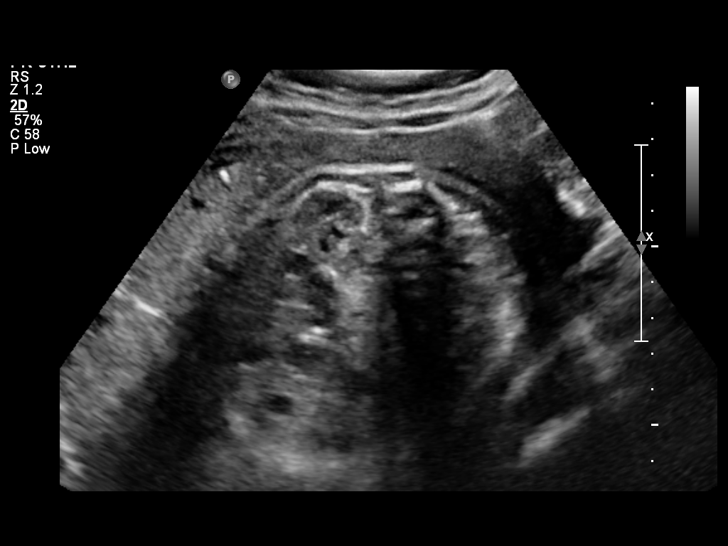
[im 22/59]
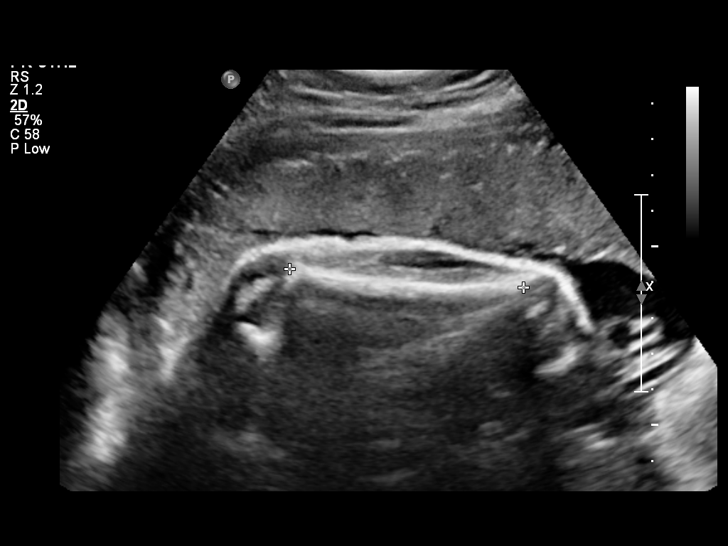
[im 26/59]
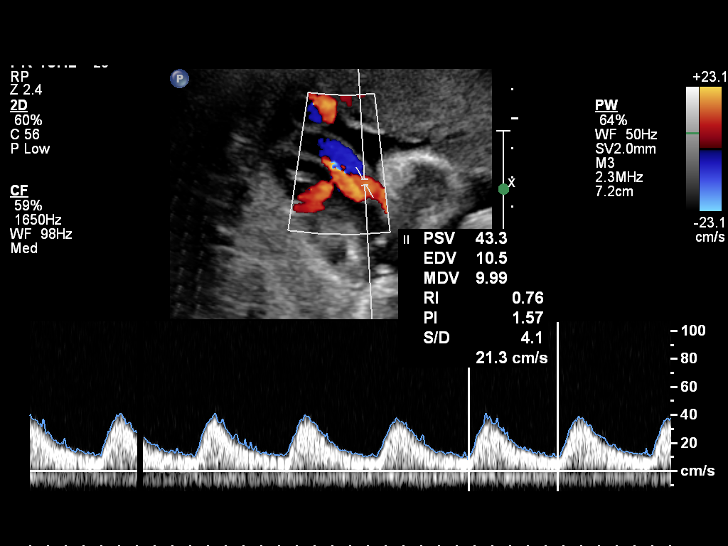
[im 33/59]
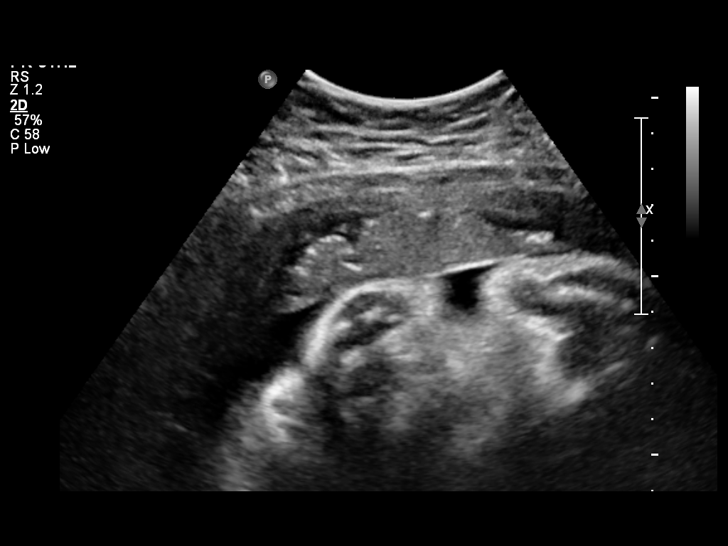
[im 37/59]
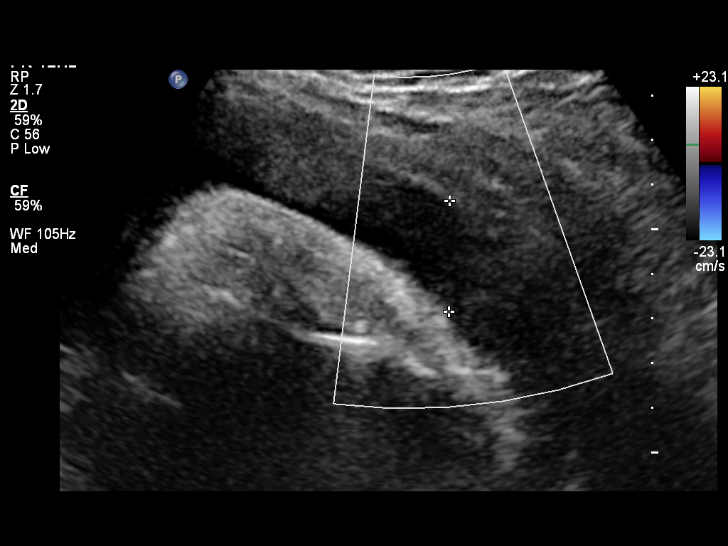
[im 41/59]
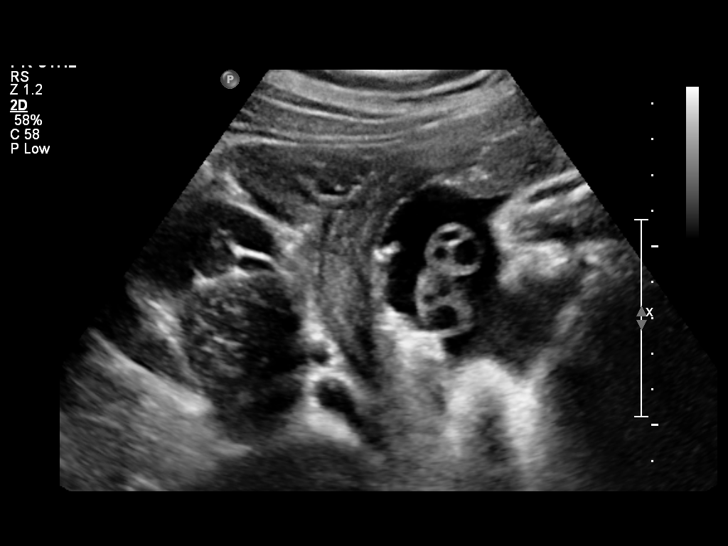
[im 48/59]
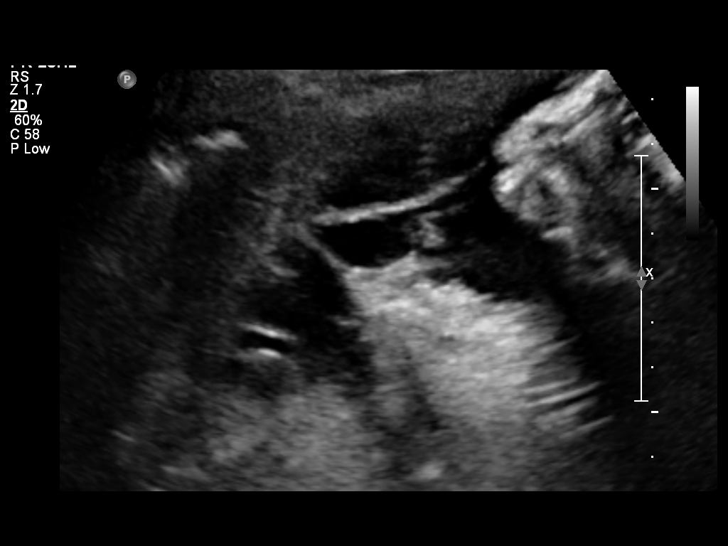
[im 52/59]
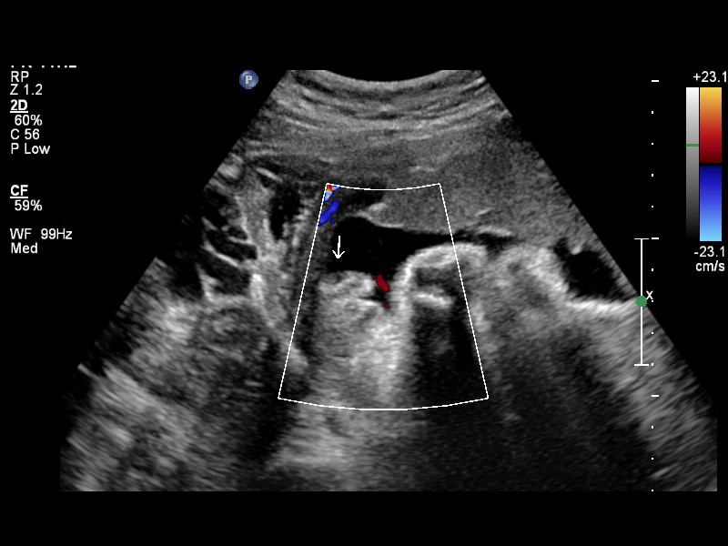
[im 56/59]
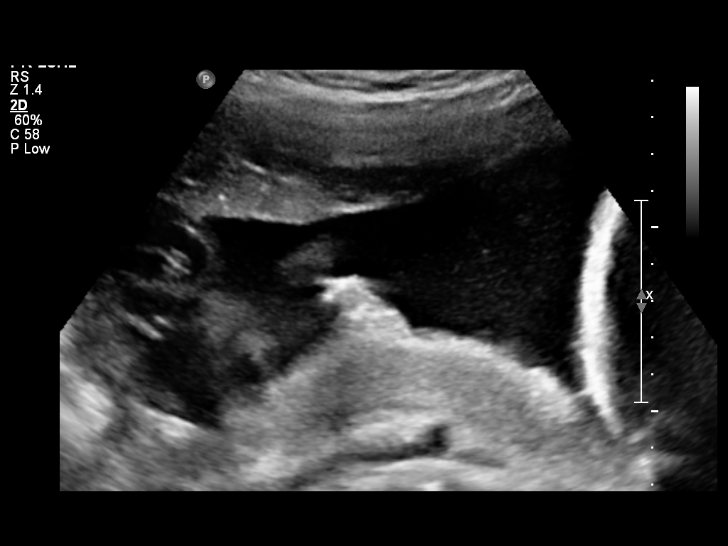

[12 of 28 positions shown; findings below may reference images not displayed]

OBSTETRICS REPORT
                      (Signed Final 01/27/2014 [DATE])

Service(s) Provided

 US OB FOLLOW UP                                       76816.1
 US UA CORD DOPPLER                                    76820.0
Indications

 Hypertension - Gestational (Labetalol)
 Poor obstetric history: Previous preeclampsia
 Poor obstetrical history (gestational hypertension
 in prior pregnancy)
Fetal Evaluation

 Num Of Fetuses:    1
 Fetal Heart Rate:  111                          bpm
 Cardiac Activity:  Observed
 Presentation:      Cephalic
 Placenta:          Anterior, above cervical os

 Amniotic Fluid
 AFI FV:      Subjectively within normal limits
 AFI Sum:     11.13   cm       28  %Tile     Larg Pckt:    3.96  cm
 RUQ:   3.75    cm   RLQ:    3.96   cm    LUQ:   0.94    cm   LLQ:    2.48   cm
Biometry

 BPD:     82.6  mm     G. Age:  33w 2d                CI:        73.89   70 - 86
                                                      FL/HC:      21.7   19.4 -

 HC:     305.2  mm     G. Age:  34w 0d       12   %   HC/AC:      1.02   0.96 -

 AC:       298  mm     G. Age:  33w 6d       40   %   FL/BPD:     80.3   71 - 87
 FL:      66.3  mm     G. Age:  34w 1d       37   %   FL/AC:      22.2   20 - 24

 Est. FW:    7775   gm     5 lb 1 oz     51  %
Gestational Age

 LMP:           30w 3d        Date:  06/27/13                 EDD:   04/03/14
 U/S Today:     33w 6d                                        EDD:   03/10/14
 Best:          34w 2d     Det. By:  U/S    (11/14/13)        EDD:   03/07/14
Anatomy

 Cranium:          Appears normal         Aortic Arch:      Previously seen
 Fetal Cavum:      Previously seen        Ductal Arch:      Not well visualized
 Ventricles:       Appears normal         Diaphragm:        Previously seen
 Choroid Plexus:   Previously seen        Stomach:          Appears normal, left
                                                            sided
 Cerebellum:       Previously seen        Abdomen:          Appears normal
 Posterior Fossa:  Previously seen        Abdominal Wall:   Previously seen
 Nuchal Fold:      Not applicable (>20    Cord Vessels:     Previously seen
                   wks GA)
 Face:             Orbits and profile     Kidneys:          Appear normal
                   previously seen
 Lips:             Previously seen        Bladder:          Appears normal
 Heart:            Previously seen        Spine:            Previously seen
 RVOT:             Previously seen        Lower             Previously seen
                                          Extremities:
 LVOT:             Previously seen        Upper             Previously seen
                                          Extremities:

 Other:  Heels and 5th digit previously seen. Technically difficult due to
         advanced gestational age and fetal position.
Doppler - Fetal Vessels

 Umbilical Artery
 S/D:   4.6        > 97.5  %tile
 Umbilical Artery
 Absent DFV:     No    Reverse DFV:    No

Cervix Uterus Adnexa

 Cervix:       Not visualized (advanced GA >72wks)
 Uterus:       No abnormality visualized.
Impression

 Single IUP at 34w 2d
 Gestational hypertension
 Fetal growth is appropriate (51st %tile)
 Elevated UA Doppler studies noted, but no AEDF or REDF is
 noted
 Normal amniotic fluid volume

 An echogenic structure is noted along the right posterior
 lateral uterus - ? atypical synechia - likely of no clinical
 significance
Recommendations

 Recommend weekly UA Doppler studies while hospitalized.
 Otherwise follow up ultrasounds as clinically inidcated

 questions or concerns.

## 2015-03-10 ENCOUNTER — Encounter (HOSPITAL_COMMUNITY): Payer: Self-pay

## 2015-03-10 ENCOUNTER — Emergency Department (HOSPITAL_COMMUNITY)
Admission: EM | Admit: 2015-03-10 | Discharge: 2015-03-10 | Disposition: A | Discharge status: Home / Self Care | Payer: Medicaid Other | Attending: Emergency Medicine | Admitting: Emergency Medicine

## 2015-03-10 ENCOUNTER — Emergency Department (HOSPITAL_COMMUNITY): Payer: Medicaid Other

## 2015-03-10 DIAGNOSIS — Z7982 Long term (current) use of aspirin: Secondary | ICD-10-CM | POA: Diagnosis not present

## 2015-03-10 DIAGNOSIS — Z3202 Encounter for pregnancy test, result negative: Secondary | ICD-10-CM | POA: Insufficient documentation

## 2015-03-10 DIAGNOSIS — Z8619 Personal history of other infectious and parasitic diseases: Secondary | ICD-10-CM | POA: Insufficient documentation

## 2015-03-10 DIAGNOSIS — Z87891 Personal history of nicotine dependence: Secondary | ICD-10-CM | POA: Diagnosis not present

## 2015-03-10 DIAGNOSIS — R109 Unspecified abdominal pain: Secondary | ICD-10-CM | POA: Diagnosis present

## 2015-03-10 DIAGNOSIS — B9689 Other specified bacterial agents as the cause of diseases classified elsewhere: Secondary | ICD-10-CM

## 2015-03-10 DIAGNOSIS — Z79899 Other long term (current) drug therapy: Secondary | ICD-10-CM | POA: Diagnosis not present

## 2015-03-10 DIAGNOSIS — N760 Acute vaginitis: Secondary | ICD-10-CM | POA: Diagnosis not present

## 2015-03-10 LAB — COMPREHENSIVE METABOLIC PANEL
ALBUMIN: 3.3 g/dL — AB (ref 3.5–5.0)
ALK PHOS: 37 U/L — AB (ref 38–126)
ALT: 19 U/L (ref 14–54)
ANION GAP: 11 (ref 5–15)
AST: 22 U/L (ref 15–41)
BUN: 7 mg/dL (ref 6–20)
CALCIUM: 8.8 mg/dL — AB (ref 8.9–10.3)
CO2: 18 mmol/L — AB (ref 22–32)
Chloride: 109 mmol/L (ref 101–111)
Creatinine, Ser: 0.7 mg/dL (ref 0.44–1.00)
GFR calc non Af Amer: >60 mL/min (ref >60)
Glucose, Bld: 126 mg/dL — ABNORMAL HIGH (ref 65–99)
POTASSIUM: 3.4 mmol/L — AB (ref 3.5–5.1)
Sodium: 138 mmol/L (ref 135–145)
Total Bilirubin: 0.3 mg/dL (ref 0.3–1.2)
Total Protein: 6.1 g/dL — ABNORMAL LOW (ref 6.5–8.1)

## 2015-03-10 LAB — URINALYSIS, ROUTINE W REFLEX MICROSCOPIC
Bilirubin Urine: NEGATIVE
Glucose, UA: NEGATIVE mg/dL
HGB URINE DIPSTICK: NEGATIVE
Ketones, ur: NEGATIVE mg/dL
Leukocytes, UA: NEGATIVE
NITRITE: NEGATIVE
Protein, ur: NEGATIVE mg/dL
Specific Gravity, Urine: 1.026 (ref 1.005–1.030)
UROBILINOGEN UA: 1 mg/dL (ref 0.0–1.0)
pH: 6.5 (ref 5.0–8.0)

## 2015-03-10 LAB — CBC WITH DIFFERENTIAL/PLATELET
BASOS PCT: 0 %
Basophils Absolute: 0 10*3/uL (ref 0.0–0.1)
EOS ABS: 0.1 10*3/uL (ref 0.0–0.7)
EOS PCT: 0 %
HCT: 36 % (ref 36.0–46.0)
HEMOGLOBIN: 12.1 g/dL (ref 12.0–15.0)
LYMPHS ABS: 2.5 10*3/uL (ref 0.7–4.0)
Lymphocytes Relative: 18 %
MCH: 30.3 pg (ref 26.0–34.0)
MCHC: 33.6 g/dL (ref 30.0–36.0)
MCV: 90.2 fL (ref 78.0–100.0)
MONO ABS: 1 10*3/uL (ref 0.1–1.0)
Monocytes Relative: 8 %
Neutro Abs: 10.2 10*3/uL — ABNORMAL HIGH (ref 1.7–7.7)
Neutrophils Relative %: 74 %
PLATELETS: 288 10*3/uL (ref 150–400)
RBC: 3.99 MIL/uL (ref 3.87–5.11)
RDW: 13 % (ref 11.5–15.5)
WBC: 13.8 10*3/uL — ABNORMAL HIGH (ref 4.0–10.5)

## 2015-03-10 LAB — WET PREP, GENITAL
Trich, Wet Prep: NONE SEEN
Yeast Wet Prep HPF POC: NONE SEEN

## 2015-03-10 LAB — I-STAT BETA HCG BLOOD, ED (MC, WL, AP ONLY)

## 2015-03-10 LAB — LIPASE, BLOOD: Lipase: 22 U/L (ref 11–51)

## 2015-03-10 MED ORDER — METRONIDAZOLE 500 MG PO TABS: 500.0000 mg | ORAL_TABLET | Freq: Two times a day (BID) | ORAL | Status: DC

## 2015-03-10 MED ORDER — ONDANSETRON HCL 4 MG PO TABS: 4.0000 mg | ORAL_TABLET | Freq: Four times a day (QID) | ORAL | Status: DC

## 2015-03-10 MED ORDER — MORPHINE SULFATE (PF) 4 MG/ML IV SOLN
4.0000 mg | Freq: Once | INTRAVENOUS | Status: AC
  Administered 2015-03-10: 4 mg via INTRAVENOUS
  Filled 2015-03-10: qty 1

## 2015-03-10 MED ORDER — DICYCLOMINE HCL 20 MG PO TABS: 20.0000 mg | ORAL_TABLET | Freq: Two times a day (BID) | ORAL | Status: DC

## 2015-03-10 MED ORDER — OXYCODONE-ACETAMINOPHEN 5-325 MG PO TABS
2.0000 | ORAL_TABLET | Freq: Once | ORAL | Status: AC
  Administered 2015-03-10: 2 via ORAL
  Filled 2015-03-10: qty 2

## 2015-03-10 MED ORDER — HYDROCODONE-ACETAMINOPHEN 5-325 MG PO TABS: 1.0000 | ORAL_TABLET | Freq: Four times a day (QID) | ORAL | Status: DC | PRN

## 2015-03-10 MED ORDER — IOHEXOL 300 MG/ML  SOLN
100.0000 mL | Freq: Once | INTRAMUSCULAR | Status: AC | PRN
  Administered 2015-03-10: 100 mL via INTRAVENOUS

## 2015-03-10 MED ORDER — SODIUM CHLORIDE 0.9 % IV BOLUS (SEPSIS)
1000.0000 mL | Freq: Once | INTRAVENOUS | Status: AC
  Administered 2015-03-10: 1000 mL via INTRAVENOUS

## 2015-03-10 MED ORDER — ONDANSETRON HCL 4 MG/2ML IJ SOLN
4.0000 mg | Freq: Once | INTRAMUSCULAR | Status: AC
  Administered 2015-03-10: 4 mg via INTRAVENOUS
  Filled 2015-03-10: qty 2

## 2015-03-10 NOTE — ED Provider Notes (Signed)
CSN: [id number]     Arrival date & time 03/10/15  0715 History   First MD Initiated Contact with Patient 03/10/15 [phone]     Chief Complaint  Patient presents with  . Abdominal Pain     (Consider location/radiation/quality/duration/timing/severity/associated sxs/prior Treatment) HPI  This is a 24 year old female who presents emergency Department with her mother. She complains of abdominal pain which woke her from sleep this morning at 1:30 AM. She has had some associated intermittent nausea without vomiting. She complains of pain starting in the lower quadrants of her abdomen. It is now up to her epigastrium. She states it radiates around her rib margin and into her back. She has no history of constipation. Patient states she took a bowel movement, has had no relief of her symptoms. She denies a history of previous surgeries to her abdomen. Last menstrual period was 02/26/2015. Patient denies urinary symptoms, fever, chills, ingestion of suspicious foods, recent foreign travel. Past Medical History  Diagnosis Date  . Pregnancy induced hypertension   . Chlamydia 11/2010    treated  . Prior pregnancy with chronic hypertension and superimposed severe preeclampsia, requiring IOL at 34 weeks 03/15/2014   Past Surgical History  Procedure Laterality Date  . Wisdom tooth extraction     Family History  Problem Relation Age of Onset  . Kidney Stones Mother   . Hypertension Maternal Grandmother    Social History  Substance Use Topics  . Smoking status: Former Smoker -- 0.25 packs/day for 1 years    Quit date: 09/06/2010  . Smokeless tobacco: Never Used  . Alcohol Use: No   OB History    Gravida Para Term Preterm AB TAB SAB Ectopic Multiple Living   3 3 2 1  0 0 0 0 0 3     Review of Systems  Ten systems reviewed and are negative for acute change, except as noted in the HPI.    Allergies  Bee venom  Home Medications   Prior to Admission medications   Medication Sig Start Date End  Date Taking? Authorizing Provider  amLODipine (NORVASC) 10 MG tablet Take 1 tablet (10 mg total) by mouth daily. 03/15/14   Tereso NewcomerUgonna A Anyanwu, MD  aspirin 81 MG chewable tablet Chew 162-324 mg by mouth daily as needed for headache.    Historical Provider, MD  hydrochlorothiazide (HYDRODIURIL) 50 MG tablet Take 1 tablet (50 mg total) by mouth daily. 01/31/14   Allie BossierMyra C Dove, MD  ibuprofen (ADVIL,MOTRIN) 600 MG tablet Take 1 tablet (600 mg total) by mouth every 6 (six) hours. 01/31/14   Allie BossierMyra C Dove, MD  labetalol (NORMODYNE) 100 MG tablet Take 400 mg by mouth 3 (three) times daily. 01/21/14   Ethelda Chickaroline Roberts, MD  Prenatal Vit-Fe Fumarate-FA (PRENATAL MULTIVITAMIN) TABS tablet Take 1 tablet by mouth daily at 12 noon.    Historical Provider, MD   BP 167/107 mmHg  Pulse 88  Temp(Src) 98.4 F (36.9 C) (Oral)  Resp 18  Ht 5\' 5"  (1.651 m)  Wt 235 lb (106.595 kg)  BMI 39.11 kg/m2  SpO2 100%  LMP 02/26/2015 (Within Days) Physical Exam Physical Exam  Nursing note and vitals reviewed. Constitutional: She is oriented to person, place, and time. She appears well-developed and well-nourished. No distress. She appears uncomfortable HENT:  Head: Normocephalic and atraumatic.  Eyes: Conjunctivae normal and EOM are normal. Pupils are equal, round, and reactive to light. No scleral icterus.  Neck: Normal range of motion.  Cardiovascular: Normal rate, regular rhythm and normal  heart sounds.  Exam reveals no gallop and no friction rub.   No murmur heard. Pulmonary/Chest: Effort normal and breath sounds normal. No respiratory distress.  Abdominal: Soft. Bowel sounds are quiet and hypoactive. She has diffuse abdominal tenderness which is worse in the epigastrium and right lower quadrant. Negative Murphy sign.   Neurological: She is alert and oriented to person, place, and time.  Skin: Skin is warm and dry. She is not diaphoretic.    ED Course  Procedures (including critical care time) Labs Review Labs  Reviewed  WET PREP, GENITAL - Abnormal; Notable for the following:    Clue Cells Wet Prep HPF POC TOO NUMEROUS TO COUNT (*)    WBC, Wet Prep HPF POC MODERATE (*)    All other components within normal limits  CBC WITH DIFFERENTIAL/PLATELET - Abnormal; Notable for the following:    WBC 13.8 (*)    Neutro Abs 10.2 (*)    All other components within normal limits  COMPREHENSIVE METABOLIC PANEL - Abnormal; Notable for the following:    Potassium 3.4 (*)    CO2 18 (*)    Glucose, Bld 126 (*)    Calcium 8.8 (*)    Total Protein 6.1 (*)    Albumin 3.3 (*)    Alkaline Phosphatase 37 (*)    All other components within normal limits  URINALYSIS, ROUTINE W REFLEX MICROSCOPIC (NOT AT [facility]) - Abnormal; Notable for the following:    APPearance HAZY (*)    All other components within normal limits  LIPASE, BLOOD  HIV ANTIBODY (ROUTINE TESTING)  RPR  I-STAT BETA HCG BLOOD, ED ([facility], [facility], [facility] ONLY)  GC/CHLAMYDIA PROBE AMP ([place]) NOT AT [facility]    Imaging Review No results found. I have personally reviewed and evaluated these images and lab results as part of my medical decision-making.   EKG Interpretation None      MDM   Final diagnoses:  Abdominal pain, unspecified abdominal location  BV (bacterial vaginosis)    9:10 AM  BP 145/67 mmHg  Pulse 89  Temp(Src) 98.4 F (36.9 C) (Oral)  Resp 18  Ht  (1.651 m)  Wt 235 lb (106.595 kg)  BMI 39.11 kg/m2  SpO2 100%  LMP 02/26/2015 (Within Days) She is hemodynamically stable. Hypertensive with a history of chronic hypertension. She has a leukocytosis, abdominal pain. We will obtain CT abdomen and pelvis with contrast. Negative pregnancy test. Pain addressed with IV morphine 4 mg and Zofran. Patient here with abdominal pain. White blood cell count is elevated. We'll obtain CT of the abdomen.    CT shows fluid. Dx includes PID/pancreatitis. No increase in lipase She denies vaginals sxs/  Pelvic exam: normal external genitalia,  vulva, vagina, cervix, uterus and adnexa.   Wet prep shows BV .  G/c chlamydia pending. Will dc with flagyl. OB follow     Arthor Captain, PA-C 03/11/15 1803  Laurence Spates, MD 03/12/15 1004

## 2015-03-10 NOTE — ED Notes (Signed)
Pt reports she had abd pain that woke her from her sleep that started around 0130. Pt denies n/v. Pt reports she had a bowel movement this morning but pain did not subside. Pt also reports she sat in a warm tub with no relief.

## 2015-03-10 NOTE — Discharge Instructions (Signed)
Bacterial Vaginosis °Bacterial vaginosis is a vaginal infection that occurs when the normal balance of bacteria in the vagina is disrupted. It results from an overgrowth of certain bacteria. This is the most common vaginal infection in women of childbearing age. Treatment is important to prevent complications, especially in pregnant women, as it can cause a premature delivery. °CAUSES  °Bacterial vaginosis is caused by an increase in harmful bacteria that are normally present in smaller amounts in the vagina. Several different kinds of bacteria can cause bacterial vaginosis. However, the reason that the condition develops is not fully understood. °RISK FACTORS °Certain activities or behaviors can put you at an increased risk of developing bacterial vaginosis, including: °· Having a new sex partner or multiple sex partners. °· Douching. °· Using an intrauterine device (IUD) for contraception. °Women do not get bacterial vaginosis from toilet seats, bedding, swimming pools, or contact with objects around them. °SIGNS AND SYMPTOMS  °Some women with bacterial vaginosis have no signs or symptoms. Common symptoms include: °· Grey vaginal discharge. °· A fishlike odor with discharge, especially after sexual intercourse. °· Itching or burning of the vagina and vulva. °· Burning or pain with urination. °DIAGNOSIS  °Your health care provider will take a medical history and examine the vagina for signs of bacterial vaginosis. A sample of vaginal fluid may be taken. Your health care provider will look at this sample under a microscope to check for bacteria and abnormal cells. A vaginal pH test may also be done.  °TREATMENT  °Bacterial vaginosis may be treated with antibiotic medicines. These may be given in the form of a pill or a vaginal cream. A second round of antibiotics may be prescribed if the condition comes back after treatment. Because bacterial vaginosis increases your risk for sexually transmitted diseases, getting  treated can help reduce your risk for chlamydia, gonorrhea, HIV, and herpes. °HOME CARE INSTRUCTIONS  °· Only take over-the-counter or prescription medicines as directed by your health care provider. °· If antibiotic medicine was prescribed, take it as directed. Make sure you finish it even if you start to feel better. °· Tell all sexual partners that you have a vaginal infection. They should see their health care provider and be treated if they have problems, such as a mild rash or itching. °· During treatment, it is important that you follow these instructions: °¨ Avoid sexual activity or use condoms correctly. °¨ Do not douche. °¨ Avoid alcohol as directed by your health care provider. °¨ Avoid breastfeeding as directed by your health care provider. °SEEK MEDICAL CARE IF:  °· Your symptoms are not improving after 3 days of treatment. °· You have increased discharge or pain. °· You have a fever. °MAKE SURE YOU:  °· Understand these instructions. °· Will watch your condition. °· Will get help right away if you are not doing well or get worse. °FOR MORE INFORMATION  °Centers for Disease Control and Prevention, Division of STD Prevention: www.cdc.gov/std °American Sexual Health Association (ASHA): www.ashastd.org  °  °This information is not intended to replace advice given to you by your health care provider. Make sure you discuss any questions you have with your health care provider. °  °Document Released: 04/19/2005 Document Revised: 05/10/2014 Document Reviewed: 11/29/2012 °Elsevier Interactive Patient Education ©2016 [employer]. ° ° °Abdominal Pain, Adult °Many things can cause abdominal pain. Usually, abdominal pain is not caused by a disease and will improve without treatment. It can often be observed and treated at home. Your health care   will do a physical exam and possibly order blood tests and X-rays to help determine the seriousness of your pain. However, in many cases, more time must pass before  a clear cause of the pain can be found. Before that point, your health care provider may not know if you need more testing or further treatment. HOME CARE INSTRUCTIONS Monitor your abdominal pain for any changes. The following actions may help to alleviate any discomfort you are experiencing:  Only take over-the-counter or prescription medicines as directed by your health care provider.  Do not take laxatives unless directed to do so by your health care provider.  Try a clear liquid diet (broth, tea, or water) as directed by your health care provider. Slowly move to a bland diet as tolerated. SEEK MEDICAL CARE IF:  You have unexplained abdominal pain.  You have abdominal pain associated with nausea or diarrhea.  You have pain when you urinate or have a bowel movement.  You experience abdominal pain that wakes you in the night.  You have abdominal pain that is worsened or improved by eating food.  You have abdominal pain that is worsened with eating fatty foods.  You have a fever. SEEK IMMEDIATE MEDICAL CARE IF:  Your pain does not go away within 2 hours.  You keep throwing up (vomiting).  Your pain is felt only in portions of the abdomen, such as the right side or the left lower portion of the abdomen.  You pass bloody or black tarry stools. MAKE SURE YOU:  Understand these instructions.  Will watch your condition.  Will get help right away if you are not doing well or get worse.   This information is not intended to replace advice given to you by your health care provider. Make sure you discuss any questions you have with your health care provider.   Document Released: 01/27/2005 Document Revised: 01/08/2015 Document Reviewed: 12/27/2012 Elsevier Interactive Patient Education 2016 [employer] Inc[employer] Acetaminophen; Hydrocodone tablets or capsules What is this medicine? ACETAMINOPHEN; HYDROCODONE (a set a MEE noe fen; hye droe KOE done) is a pain reliever. It is used to  treat moderate to severe pain. This medicine may be used for other purposes; ask your health care provider or pharmacist if you have questions. What should I tell my health care provider before I take this medicine? They need to know if you have any of these conditions: -brain tumor -Crohn's disease, inflammatory bowel disease, or ulcerative colitis -drug abuse or addiction -head injury -heart or circulation problems -if you often drink alcohol -kidney disease or problems going to the bathroom -liver disease -lung disease, asthma, or breathing problems -an unusual or allergic reaction to acetaminophen, hydrocodone, other opioid analgesics, other medicines, foods, dyes, or preservatives -pregnant or trying to get pregnant -breast-feeding How should I use this medicine? Take this medicine by mouth. Swallow it with a full glass of water. Follow the directions on the prescription label. If the medicine upsets your stomach, take the medicine with food or milk. Do not take more than you are told to take. Talk to your pediatrician regarding the use of this medicine in children. This medicine is not approved for use in children. Patients over 65 years may have a stronger reaction and need a smaller dose. Overdosage: If you think you have taken too much of this medicine contact a poison control center or emergency room at once. NOTE: This medicine is only for you. Do not share this medicine with others. What if  I miss a dose? If you miss a dose, take it as soon as you can. If it is almost time for your next dose, take only that dose. Do not take double or extra doses. What may interact with this medicine? -alcohol -antihistamines -isoniazid -medicines for depression, anxiety, or psychotic disturbances -medicines for sleep -muscle relaxants -naltrexone -narcotic medicines (opiates) for pain -phenobarbital -ritonavir -tramadol This list may not describe all possible interactions. Give your  health care provider a list of all the medicines, herbs, non-prescription drugs, or dietary supplements you use. Also tell them if you smoke, drink alcohol, or use illegal drugs. Some items may interact with your medicine. What should I watch for while using this medicine? Tell your doctor or health care professional if your pain does not go away, if it gets worse, or if you have new or a different type of pain. You may develop tolerance to the medicine. Tolerance means that you will need a higher dose of the medicine for pain relief. Tolerance is normal and is expected if you take the medicine for a long time. Do not suddenly stop taking your medicine because you may develop a severe reaction. Your body becomes used to the medicine. This does NOT mean you are addicted. Addiction is a behavior related to getting and using a drug for a non-medical reason. If you have pain, you have a medical reason to take pain medicine. Your doctor will tell you how much medicine to take. If your doctor wants you to stop the medicine, the dose will be slowly lowered over time to avoid any side effects. You may get drowsy or dizzy when you first start taking the medicine or change doses. Do not drive, use machinery, or do anything that may be dangerous until you know how the medicine affects you. Stand or sit up slowly. There are different types of narcotic medicines (opiates) for pain. If you take more than one type at the same time, you may have more side effects. Give your health care provider a list of all medicines you use. Your doctor will tell you how much medicine to take. Do not take more medicine than directed. Call emergency for help if you have problems breathing. The medicine will cause constipation. Try to have a bowel movement at least every 2 to 3 days. If you do not have a bowel movement for 3 days, call your doctor or health care professional. Too much acetaminophen can be very dangerous. Do not take Tylenol  (acetaminophen) or medicines that contain acetaminophen with this medicine. Many non-prescription medicines contain acetaminophen. Always read the labels carefully. What side effects may I notice from receiving this medicine? Side effects that you should report to your doctor or health care professional as soon as possible: -allergic reactions like skin rash, itching or hives, swelling of the face, lips, or tongue -breathing problems -confusion -feeling faint or lightheaded, falls -stomach pain -yellowing of the eyes or skin Side effects that usually do not require medical attention (report to your doctor or health care professional if they continue or are bothersome): -nausea, vomiting -stomach upset This list may not describe all possible side effects. Call your doctor for medical advice about side effects. You may report side effects to FDA at 1-800-FDA-1088. Where should I keep my medicine? Keep out of the reach of children. This medicine can be abused. Keep your medicine in a safe place to protect it from theft. Do not share this medicine with anyone. Selling or  giving away this medicine is dangerous and against the law. This medicine may cause accidental overdose and death if it taken by other adults, children, or pets. Mix any unused medicine with a substance like cat litter or coffee grounds. Then throw the medicine away in a sealed container like a sealed bag or a coffee can with a lid. Do not use the medicine after the expiration date. Store at room temperature between 15 and 30 degrees C (59 and 86 degrees F). NOTE: This sheet is a summary. It may not cover all possible information. If you have questions about this medicine, talk to your doctor, pharmacist, or health care provider.    2016, Elsevier/Gold Standard. (2014-03-20 15:29:20) Metronidazole extended-release tablets What is this medicine? METRONIDAZOLE (me troe NI da zole) is an antiinfective. It is used to treat certain  kinds of bacterial and protozoal infections. It will not work for colds, flu, or other viral infections. This medicine may be used for other purposes; ask your health care provider or pharmacist if you have questions. What should I tell my health care provider before I take this medicine? They need to know if you have any of these conditions: -anemia or other blood disorders -disease of the nervous system -fungal or yeast infection -if you drink alcohol containing drinks -liver disease -seizures -an unusual or allergic reaction to metronidazole, or other medicines, foods, dyes, or preservatives -pregnant or trying to get pregnant -breast-feeding How should I use this medicine? Take this medicine by mouth with a full glass of water. Follow the directions on the prescription label. Do not crush or chew. Take this medicine on an empty stomach 1 hour before or 2 hours after meals or food. Take your medicine at regular intervals. Do not take your medicine more often than directed. Take all of your medicine as directed even if you think you are better. Do not skip doses or stop your medicine early. Talk to your pediatrician regarding the use of this medicine in children. Special care may be needed. Overdosage: If you think you have taken too much of this medicine contact a poison control center or emergency room at once. NOTE: This medicine is only for you. Do not share this medicine with others. What if I miss a dose? If you miss a dose, take it as soon as you can. If it is almost time for your next dose, take only that dose. Do not take double or extra doses. What may interact with this medicine? Do not take this medicine with any of the following medications: -alcohol or any product that contains alcohol -amprenavir oral solution -cisapride -disulfiram -dofetilide -dronedarone -paclitaxel injection -pimozide -ritonavir oral solution -sertraline oral  solution -sulfamethoxazole-trimethoprim injection -thioridazine -ziprasidone This medicine may also interact with the following medications: -birth control pills -cimetidine -lithium -other medicines that prolong the QT interval (cause an abnormal heart rhythm) -phenobarbital -phenytoin -warfarin This list may not describe all possible interactions. Give your health care provider a list of all the medicines, herbs, non-prescription drugs, or dietary supplements you use. Also tell them if you smoke, drink alcohol, or use illegal drugs. Some items may interact with your medicine. What should I watch for while using this medicine? Tell your doctor or health care professional if your symptoms do not improve or if they get worse. You may get drowsy or dizzy. Do not drive, use machinery, or do anything that needs mental alertness until you know how this medicine affects you. Do not stand or  sit up quickly, especially if you are an older patient. This reduces the risk of dizzy or fainting spells. Avoid alcoholic drinks while you are taking this medicine and for three days afterward. Alcohol may make you feel dizzy, sick, or flushed. If you are being treated for a sexually transmitted disease, avoid sexual contact until you have finished your treatment. Your sexual partner may also need treatment. What side effects may I notice from receiving this medicine? Side effects that you should report to your doctor or health care professional as soon as possible: -allergic reactions like skin rash, itching or hives, swelling of the face, lips, or tongue -confusion, clumsiness -difficulty speaking -discolored or sore mouth -dizziness -fever, infection -numbness, tingling, pain or weakness in the hands or feet -trouble passing urine or change in the amount of urine -redness, blistering, peeling or loosening of the skin, including inside the mouth -seizures -unusually weak or tired -vaginal irritation,  dryness, or discharge Side effects that usually do not require medical attention (report to your doctor or health care professional if they continue or are bothersome): -diarrhea -headache -irritability -metallic taste -nausea -stomach pain or cramps -trouble sleeping This list may not describe all possible side effects. Call your doctor for medical advice about side effects. You may report side effects to FDA at 1-800-FDA-1088. Where should I keep my medicine? Keep out of the reach of children. Store at room temperature between 15 and 30 degrees C (59 and 86 degrees F). Protect from light and moisture. Keep container tightly closed. Throw away any unused medicine after the expiration date. NOTE: This sheet is a summary. It may not cover all possible information. If you have questions about this medicine, talk to your doctor, pharmacist, or health care provider.    2016, Elsevier/Gold Standard. (2012-11-24 14:05:10) Dicyclomine tablets or capsules What is this medicine? DICYCLOMINE (dye SYE kloe meen) is used to treat bowel problems including irritable bowel syndrome. This medicine may be used for other purposes; ask your health care provider or pharmacist if you have questions. What should I tell my health care provider before I take this medicine? They need to know if you have any of these conditions: -difficulty passing urine -esophagus problems or heartburn -glaucoma -heart disease, or previous heart attack -myasthenia gravis -prostate trouble -stomach infection, or obstruction -ulcerative colitis -an unusual or allergic reaction to dicyclomine, other medicines, foods, dyes, or preservatives -pregnant or trying to get pregnant -breast-feeding How should I use this medicine? Take this medicine by mouth with a glass of water. Follow the directions on the prescription label. It is best to take this medicine on an empty stomach, 30 minutes to 1 hour before meals. Take your medicine  at regular intervals. Do not take your medicine more often than directed. Talk to your pediatrician regarding the use of this medicine in children. Special care may be needed. While this drug may be prescribed for children as young as 58 months of age for selected conditions, precautions do apply. Patients over 34 years old may have a stronger reaction and need a smaller dose. Overdosage: If you think you have taken too much of this medicine contact a poison control center or emergency room at once. NOTE: This medicine is only for you. Do not share this medicine with others. What if I miss a dose? If you miss a dose, take it as soon as you can. If it is almost time for your next dose, take only that dose. Do not take double  or extra doses. What may interact with this medicine? -amantadine -antacids -benztropine -digoxin -disopyramide -medicines for allergies, colds and breathing difficulties -medicines for alzheimer's disease -medicines for anxiety or sleeping problems -medicines for depression or psychotic disturbances -medicines for diarrhea -medicines for pain -metoclopramide -tegaserod This list may not describe all possible interactions. Give your health care provider a list of all the medicines, herbs, non-prescription drugs, or dietary supplements you use. Also tell them if you smoke, drink alcohol, or use illegal drugs. Some items may interact with your medicine. What should I watch for while using this medicine? You may get drowsy, dizzy, or have blurred vision. Do not drive, use machinery, or do anything that needs mental alertness until you know how this medicine affects you. To reduce the risk of dizzy or fainting spells, do not sit or stand up quickly, especially if you are an older patient. Alcohol can make you more drowsy, avoid alcoholic drinks. Stay out of bright light and wear sunglasses if this medicine makes your eyes more sensitive to light. Avoid extreme heat (hot tubs,  saunas). This medicine can cause you to sweat less than normal. Your body temperature could increase to dangerous levels, which may lead to heat stroke. Antacids can stop this medicine from working. If you get an upset stomach and want to take an antacid, make sure there is an interval of at least 1 to 2 hours before or after you take this medicine. Your mouth may get dry. Chewing sugarless gum or sucking hard candy, and drinking plenty of water may help. Contact your doctor if the problem does not go away or is severe. What side effects may I notice from receiving this medicine? Side effects that you should report to your doctor or health care professional as soon as possible: -agitation, nervousness, confusion -difficulty swallowing -dizziness, drowsiness -fast or slow heartbeat -hallucinations -pain or difficulty passing urine Side effects that usually do not require medical attention (report to your doctor or health care professional if they continue or are bothersome): -constipation -headache -nausea or vomiting -sexual difficulty This list may not describe all possible side effects. Call your doctor for medical advice about side effects. You may report side effects to FDA at 1-800-FDA-1088. Where should I keep my medicine? Keep out of the reach of children. Store at room temperature below 30 degrees C (86 degrees F). Protect from light. Throw away any unused medicine after the expiration date. NOTE: This sheet is a summary. It may not cover all possible information. If you have questions about this medicine, talk to your doctor, pharmacist, or health care provider.    2016, Elsevier/[employer] Standard. (2007-08-08 17:12:34)

## 2015-03-11 LAB — RPR: RPR: NONREACTIVE

## 2015-03-11 LAB — GC/CHLAMYDIA PROBE AMP (~~LOC~~) NOT AT ARMC
CHLAMYDIA, DNA PROBE: NEGATIVE
NEISSERIA GONORRHEA: NEGATIVE

## 2015-03-11 LAB — HIV ANTIBODY (ROUTINE TESTING W REFLEX): HIV SCREEN 4TH GENERATION: NONREACTIVE

## 2016-07-01 ENCOUNTER — Emergency Department (HOSPITAL_COMMUNITY)
Admission: EM | Admit: 2016-07-01 | Discharge: 2016-07-01 | Disposition: A | Discharge status: Home / Self Care | Payer: Medicaid Other | Attending: Emergency Medicine | Admitting: Emergency Medicine

## 2016-07-01 ENCOUNTER — Encounter (HOSPITAL_COMMUNITY): Payer: Self-pay

## 2016-07-01 DIAGNOSIS — Z87891 Personal history of nicotine dependence: Secondary | ICD-10-CM | POA: Insufficient documentation

## 2016-07-01 DIAGNOSIS — R112 Nausea with vomiting, unspecified: Secondary | ICD-10-CM | POA: Insufficient documentation

## 2016-07-01 DIAGNOSIS — R1110 Vomiting, unspecified: Secondary | ICD-10-CM

## 2016-07-01 LAB — COMPREHENSIVE METABOLIC PANEL
ALK PHOS: 43 U/L (ref 38–126)
ALT: 19 U/L (ref 14–54)
AST: 20 U/L (ref 15–41)
Albumin: 3.9 g/dL (ref 3.5–5.0)
Anion gap: 5 (ref 5–15)
BUN: 6 mg/dL (ref 6–20)
CALCIUM: 9.2 mg/dL (ref 8.9–10.3)
CO2: 22 mmol/L (ref 22–32)
CREATININE: 0.77 mg/dL (ref 0.44–1.00)
Chloride: 110 mmol/L (ref 101–111)
Glucose, Bld: 89 mg/dL (ref 65–99)
Potassium: 3.9 mmol/L (ref 3.5–5.1)
Sodium: 137 mmol/L (ref 135–145)
TOTAL PROTEIN: 7 g/dL (ref 6.5–8.1)
Total Bilirubin: 0.7 mg/dL (ref 0.3–1.2)

## 2016-07-01 LAB — URINALYSIS, ROUTINE W REFLEX MICROSCOPIC
Bacteria, UA: NONE SEEN
Bilirubin Urine: NEGATIVE
GLUCOSE, UA: NEGATIVE mg/dL
HGB URINE DIPSTICK: NEGATIVE
Ketones, ur: 80 mg/dL — AB
Leukocytes, UA: NEGATIVE
NITRITE: NEGATIVE
Protein, ur: 30 mg/dL — AB
SPECIFIC GRAVITY, URINE: 1.025 (ref 1.005–1.030)
pH: 6 (ref 5.0–8.0)

## 2016-07-01 LAB — I-STAT BETA HCG BLOOD, ED (MC, WL, AP ONLY): I-stat hCG, quantitative: <5 m[IU]/mL (ref <5)

## 2016-07-01 LAB — CBC
HCT: 37.9 % (ref 36.0–46.0)
Hemoglobin: 12.7 g/dL (ref 12.0–15.0)
MCH: 30.5 pg (ref 26.0–34.0)
MCHC: 33.5 g/dL (ref 30.0–36.0)
MCV: 91.1 fL (ref 78.0–100.0)
PLATELETS: 268 10*3/uL (ref 150–400)
RBC: 4.16 MIL/uL (ref 3.87–5.11)
RDW: 13.2 % (ref 11.5–15.5)
WBC: 10.5 10*3/uL (ref 4.0–10.5)

## 2016-07-01 LAB — LIPASE, BLOOD: LIPASE: 27 U/L (ref 11–51)

## 2016-07-01 MED ORDER — ONDANSETRON 4 MG PO TBDP
4.0000 mg | ORAL_TABLET | Freq: Once | ORAL | Status: AC
  Administered 2016-07-01: 4 mg via ORAL
  Filled 2016-07-01: qty 1

## 2016-07-01 MED ORDER — HYDROCHLOROTHIAZIDE 25 MG PO TABS
25.0000 mg | ORAL_TABLET | Freq: Every day | ORAL | Status: DC
  Administered 2016-07-01: 25 mg via ORAL
  Filled 2016-07-01: qty 1

## 2016-07-01 MED ORDER — ONDANSETRON 4 MG PO TBDP: 4.0000 mg | ORAL_TABLET | Freq: Once | ORAL | Status: DC

## 2016-07-01 MED ORDER — ONDANSETRON HCL 4 MG PO TABS: 4.0000 mg | ORAL_TABLET | Freq: Three times a day (TID) | ORAL | 0 refills | Status: DC | PRN

## 2016-07-01 MED ORDER — HYDROCHLOROTHIAZIDE 50 MG PO TABS: 50.0000 mg | ORAL_TABLET | Freq: Every day | ORAL | 1 refills | Status: DC

## 2016-07-01 NOTE — ED Notes (Signed)
Pt aware of the need of a urine sample.Pt stated that she did not need to go at this time. Pt informed to call for assistance when able to provide sample.

## 2016-07-01 NOTE — ED Triage Notes (Signed)
Pt reports she woke up this morning with emesis and headache. Possible food poisoning. Pt denies diarrhea.

## 2016-07-01 NOTE — ED Provider Notes (Signed)
[facility]-EMERGENCY DEPT Provider Note   CSN: [id number] Arrival date & time: 07/01/16  1356     History   Chief Complaint Chief Complaint  Patient presents with  . Vomiting    HPI Tara Parrish is a 26 y.o. female.   Emesis   This is a new problem. The current episode started 6 to 12 hours ago. The problem occurs 5 to 10 times per day. The problem has been gradually improving. The emesis has an appearance of stomach contents. There has been no fever. Associated symptoms include abdominal pain (after a couple episodes of vomiting). Risk factors include suspect food intake.    Past Medical History:  Diagnosis Date  . Chlamydia 11/2010   treated  . Pregnancy induced hypertension   . Prior pregnancy with chronic hypertension and superimposed severe preeclampsia, requiring IOL at 34 weeks 03/15/2014    Patient Active Problem List   Diagnosis Date Noted  . Prior pregnancy with chronic hypertension and superimposed severe preeclampsia, requiring IOL at 34 weeks 03/15/2014  . Hypertension 12/10/2013    Past Surgical History:  Procedure Laterality Date  . WISDOM TOOTH EXTRACTION      OB History    Gravida Para Term Preterm AB Living   3 3 2 1  0 3   SAB TAB Ectopic Multiple Live Births   0 0 0 0 3       Home Medications    Prior to Admission medications   Medication Sig Start Date End Date Taking? Authorizing Provider  acetaminophen (TYLENOL) 500 MG tablet Take 500 mg by mouth every 6 (six) hours as needed for mild pain.   Yes Historical Provider, MD  etonogestrel (NEXPLANON) 68 MG IMPL implant 1 each by Subdermal route once.   Yes Historical Provider, MD  hydrochlorothiazide (HYDRODIURIL) 50 MG tablet Take 1 tablet (50 mg total) by mouth daily. 07/01/16   Marily Memos, MD  ondansetron (ZOFRAN) 4 MG tablet Take 1 tablet (4 mg total) by mouth every 8 (eight) hours as needed for nausea or vomiting. 07/01/16   Marily Memos, MD    Family History Family History  Problem  Relation Age of Onset  . Kidney Stones Mother   . Hypertension Maternal Grandmother     Social History Social History  Substance Use Topics  . Smoking status: Former Smoker    Packs/day: 0.25    Years: 1.00    Quit date: 09/06/2010  . Smokeless tobacco: Never Used  . Alcohol use No     Allergies   Bee venom   Review of Systems Review of Systems  Gastrointestinal: Positive for abdominal pain (after a couple episodes of vomiting) and vomiting.  All other systems reviewed and are negative.    Physical Exam Updated Vital Signs BP 153/85   Pulse 80   Temp 98.6 F (37 C) (Oral)   Resp 18   Ht 5' 5.5" (1.664 m)   Wt 200 lb (90.7 kg)   LMP 06/23/2016 (Within Days)   SpO2 99%   BMI 32.78 kg/m   Physical Exam  Constitutional: She is oriented to person, place, and time. She appears well-developed and well-nourished.  HENT:  Head: Normocephalic and atraumatic.  Eyes: Conjunctivae and EOM are normal.  Neck: Normal range of motion.  Cardiovascular: Normal rate and regular rhythm.   No murmur heard. Pulmonary/Chest: No stridor. No respiratory distress.  Abdominal: Soft. She exhibits no distension. There is no tenderness. There is no guarding.  Musculoskeletal: Normal range of motion. She  exhibits no edema or deformity.  Neurological: She is alert and oriented to person, place, and time. No cranial nerve deficit. Coordination normal.  Skin: Skin is warm and dry. No pallor.  Nursing note and vitals reviewed.    ED Treatments / Results  Labs (all labs ordered are listed, but only abnormal results are displayed) Labs Reviewed  URINALYSIS, ROUTINE W REFLEX MICROSCOPIC - Abnormal; Notable for the following:       Result Value   APPearance CLOUDY (*)    Ketones, ur 80 (*)    Protein, ur 30 (*)    Squamous Epithelial / LPF 6-30 (*)    All other components within normal limits  LIPASE, BLOOD  COMPREHENSIVE METABOLIC PANEL  CBC  URINALYSIS, ROUTINE W REFLEX MICROSCOPIC    I-STAT BETA HCG BLOOD, ED ([facility], [facility], [facility] ONLY)    EKG  EKG Interpretation None       Radiology No results found.  Procedures Procedures (including critical care time)   Counseled patient for approximately 7 minutes regarding smoking cessation. Discussed risks of smoking and how they applied and affected their visit here today. Patient not ready to quit at this time, however will follow up with their primary doctor when they are.   CPT code: [id number]: intermediate counseling for smoking cessation   Medications Ordered in ED Medications  hydrochlorothiazide (HYDRODIURIL) tablet 25 mg (25 mg Oral Given 07/01/16 2149)  ondansetron (ZOFRAN-ODT) disintegrating tablet 4 mg (4 mg Oral Given 07/01/16 2051)     Initial Impression / Assessment and Plan / ED Course  I have reviewed the triage vital signs and the nursing notes.  Pertinent labs & imaging results that were available during my care of the patient were reviewed by me and considered in my medical decision making (see chart for details).    Vomiting. Possible food intake. Ok now. Abdomen benign. Asking for food with some nausea though.  Plan for zofran, UA, oral challenge. Likely discharge.  Elevated BP, will start back HCTZ and give information to get pcp follow up. No e/o end organ damage related to same.  Stable for dc. htn meds restarted.   Final Clinical Impressions(s) / ED Diagnoses   Final diagnoses:  Vomiting, intractability of vomiting not specified, presence of nausea not specified, unspecified vomiting type    New Prescriptions Discharge Medication List as of 07/01/2016 10:14 PM    START taking these medications   Details  ondansetron (ZOFRAN) 4 MG tablet Take 1 tablet (4 mg total) by mouth every 8 (eight) hours as needed for nausea or vomiting., Starting Thu 07/01/2016, Print         Marily MemosJason Armaan Pond, MD 07/01/16 [phone]

## 2016-07-01 NOTE — ED Notes (Signed)
Discharge instructions/prescriptions reviewed with patient. Patient declined wheelchair at time of discharge. Patient anxious. Stated "I've been here all day. I need to get out of here."

## 2017-04-13 ENCOUNTER — Encounter: Payer: Self-pay | Admitting: Obstetrics and Gynecology

## 2017-04-13 ENCOUNTER — Other Ambulatory Visit (HOSPITAL_COMMUNITY)
Admission: RE | Admit: 2017-04-13 | Discharge: 2017-04-13 | Disposition: A | Discharge status: Home / Self Care | Payer: Medicaid Other | Source: Ambulatory Visit | Attending: Obstetrics and Gynecology | Admitting: Obstetrics and Gynecology

## 2017-04-13 ENCOUNTER — Ambulatory Visit (INDEPENDENT_AMBULATORY_CARE_PROVIDER_SITE_OTHER): Payer: Medicaid Other | Admitting: Obstetrics and Gynecology

## 2017-04-13 VITALS — BP 149/92 | HR 70 | Ht 65.0 in | Wt 219.8 lb

## 2017-04-13 DIAGNOSIS — Z30017 Encounter for initial prescription of implantable subdermal contraceptive: Secondary | ICD-10-CM

## 2017-04-13 DIAGNOSIS — Z01419 Encounter for gynecological examination (general) (routine) without abnormal findings: Secondary | ICD-10-CM

## 2017-04-13 DIAGNOSIS — Z3046 Encounter for surveillance of implantable subdermal contraceptive: Secondary | ICD-10-CM

## 2017-04-13 DIAGNOSIS — I10 Essential (primary) hypertension: Secondary | ICD-10-CM

## 2017-04-13 DIAGNOSIS — Z309 Encounter for contraceptive management, unspecified: Secondary | ICD-10-CM | POA: Diagnosis not present

## 2017-04-13 MED ORDER — ETONOGESTREL 68 MG ~~LOC~~ IMPL
68.0000 mg | DRUG_IMPLANT | Freq: Once | SUBCUTANEOUS | Status: AC
  Administered 2017-04-13: 68 mg via SUBCUTANEOUS

## 2017-04-13 NOTE — Progress Notes (Signed)
Subjective:     Tara Parrish is a 26 y.o. female with BMI 36 who is here for a comprehensive physical exam. The patient reports no problems. Patient is sexually active using Nexplanon for contraception. She reports a monthly period lasting 1-2 weeks.  Patient is without any other complaints  Past Medical History:  Diagnosis Date  . Chlamydia 11/2010   treated  . Pregnancy induced hypertension   . Prior pregnancy with chronic hypertension and superimposed severe preeclampsia, requiring IOL at 34 weeks 03/15/2014   Past Surgical History:  Procedure Laterality Date  . WISDOM TOOTH EXTRACTION     Family History  Problem Relation Age of Onset  . Kidney Stones Mother   . Hypertension Maternal Grandmother     Social History   Socioeconomic History  . Marital status: Single    Spouse name: Not on file  . Number of children: Not on file  . Years of education: Not on file  . Highest education level: Not on file  Social Needs  . Financial resource strain: Not on file  . Food insecurity - worry: Not on file  . Food insecurity - inability: Not on file  . Transportation needs - medical: Not on file  . Transportation needs - non-medical: Not on file  Occupational History  . Not on file  Tobacco Use  . Smoking status: Former Smoker    Packs/day: 0.25    Years: 1.00    Pack years: 0.25    Last attempt to quit: 09/06/2010    Years since quitting: 6.6  . Smokeless tobacco: Never Used  Substance and Sexual Activity  . Alcohol use: No  . Drug use: No  . Sexual activity: Yes    Birth control/protection: Condom, Implant  Other Topics Concern  . Not on file  Social History Narrative  . Not on file   Health Maintenance  Topic Date Due  . INFLUENZA VACCINE  12/01/2016  . PAP SMEAR  12/10/2016  . TETANUS/TDAP  01/01/2024  . HIV Screening  Completed       Review of Systems Pertinent items are noted in HPI.   Objective:  Blood pressure (!) 149/92, pulse 70, height 5\' 5"   (1.651 m), weight 219 lb 12.8 oz (99.7 kg), last menstrual period 03/29/2017.     GENERAL: Well-developed, well-nourished female in no acute distress.  HEENT: Normocephalic, atraumatic. Sclerae anicteric.  NECK: Supple. Normal thyroid.  LUNGS: Clear to auscultation bilaterally.  HEART: Regular rate and rhythm. BREASTS: Symmetric in size. No palpable masses or lymphadenopathy, skin changes, or nipple drainage. ABDOMEN: Soft, nontender, nondistended. No organomegaly. PELVIC: Normal external female genitalia. Vagina is pink and rugated.  Normal discharge. Normal appearing cervix. Uterus is normal in size. No adnexal mass or tenderness. EXTREMITIES: No cyanosis, clubbing, or edema, 2+ distal pulses.    Assessment:    Healthy female exam.      Plan:    Pap smear collected Patient desires nexplanon removal and replacement  Removal Patient given informed consent for removal of her Nexplanon, time out was performed.  Signed copy in the chart.  Appropriate time out taken. Implanon site identified.  Area prepped in usual sterile fashon. One cc of 1% lidocaine was used to anesthetize the area at the distal end of the implant. A small stab incision was made right beside the implant on the distal portion.  The Nexplanon rod was grasped using hemostats and removed without difficulty.  There was less than 3 cc blood  loss. There were no complications.    Insertion Using the previously made incision and keeping with sterile techniques, the patient was prepped with betadine. Nexplanon removed form packaging.  Device confirmed in needle, then inserted full length of needle and withdrawn per handbook instructions.  Patient insertion site covered with a band aid and pressure dressing.   Minimal blood loss.  Patient tolerated the procedure well. A small amount of antibiotic ointment and steri-strips were applied over the small incision.  A pressure bandage was applied to reduce any bruising.  The patient  tolerated the procedure well and was given post procedure instructions.   Patient will be contacted with results See After Visit Summary for Counseling Recommendations

## 2017-04-13 NOTE — Progress Notes (Signed)
Discussed that she stopped taking bp med 2 years ago and bp is elevated today. Discussed needs to start with PCP for htn. [facility].

## 2017-04-13 NOTE — Addendum Note (Signed)
Addended by: Lorelle GibbsWILSON, Jaleen Finch L on: 04/13/2017 11:58 AM   Modules accepted: Orders

## 2017-04-14 LAB — CYTOLOGY - PAP: Diagnosis: NEGATIVE

## 2017-10-15 ENCOUNTER — Encounter (HOSPITAL_COMMUNITY): Payer: Self-pay | Admitting: Emergency Medicine

## 2017-10-15 ENCOUNTER — Emergency Department (HOSPITAL_COMMUNITY)
Admission: EM | Admit: 2017-10-15 | Discharge: 2017-10-15 | Disposition: A | Discharge status: Home / Self Care | Payer: Medicaid Other | Attending: Emergency Medicine | Admitting: Emergency Medicine

## 2017-10-15 ENCOUNTER — Other Ambulatory Visit: Payer: Self-pay

## 2017-10-15 DIAGNOSIS — R51 Headache: Secondary | ICD-10-CM | POA: Insufficient documentation

## 2017-10-15 DIAGNOSIS — Z79899 Other long term (current) drug therapy: Secondary | ICD-10-CM | POA: Insufficient documentation

## 2017-10-15 DIAGNOSIS — R519 Headache, unspecified: Secondary | ICD-10-CM

## 2017-10-15 DIAGNOSIS — I159 Secondary hypertension, unspecified: Secondary | ICD-10-CM | POA: Insufficient documentation

## 2017-10-15 DIAGNOSIS — Z87891 Personal history of nicotine dependence: Secondary | ICD-10-CM | POA: Insufficient documentation

## 2017-10-15 LAB — CBC WITH DIFFERENTIAL/PLATELET
Abs Immature Granulocytes: 0 10*3/uL (ref 0.0–0.1)
BASOS ABS: 0 10*3/uL (ref 0.0–0.1)
BASOS PCT: 0 %
EOS ABS: 0 10*3/uL (ref 0.0–0.7)
EOS PCT: 1 %
HCT: 42.2 % (ref 36.0–46.0)
Hemoglobin: 13.6 g/dL (ref 12.0–15.0)
Immature Granulocytes: 0 %
Lymphocytes Relative: 49 %
Lymphs Abs: 3 10*3/uL (ref 0.7–4.0)
MCH: 30.2 pg (ref 26.0–34.0)
MCHC: 32.2 g/dL (ref 30.0–36.0)
MCV: 93.6 fL (ref 78.0–100.0)
MONOS PCT: 6 %
Monocytes Absolute: 0.4 10*3/uL (ref 0.1–1.0)
NEUTROS ABS: 2.7 10*3/uL (ref 1.7–7.7)
Neutrophils Relative %: 44 %
PLATELETS: 348 10*3/uL (ref 150–400)
RBC: 4.51 MIL/uL (ref 3.87–5.11)
RDW: 12.7 % (ref 11.5–15.5)
WBC: 6.2 10*3/uL (ref 4.0–10.5)

## 2017-10-15 LAB — I-STAT BETA HCG BLOOD, ED (MC, WL, AP ONLY): I-stat hCG, quantitative: <5 m[IU]/mL (ref <5)

## 2017-10-15 LAB — I-STAT CHEM 8, ED
BUN: 6 mg/dL (ref 6–20)
CREATININE: 0.8 mg/dL (ref 0.44–1.00)
Calcium, Ion: 1.29 mmol/L (ref 1.15–1.40)
Chloride: 106 mmol/L (ref 101–111)
GLUCOSE: 67 mg/dL (ref 65–99)
HEMATOCRIT: 43 % (ref 36.0–46.0)
HEMOGLOBIN: 14.6 g/dL (ref 12.0–15.0)
Potassium: 3.8 mmol/L (ref 3.5–5.1)
Sodium: 143 mmol/L (ref 135–145)
TCO2: 23 mmol/L (ref 22–32)

## 2017-10-15 MED ORDER — HYDROCHLOROTHIAZIDE 25 MG PO TABS: 25.0000 mg | ORAL_TABLET | Freq: Every day | ORAL | 0 refills | Status: DC

## 2017-10-15 MED ORDER — BUTALBITAL-APAP-CAFFEINE 50-325-40 MG PO TABS: 1.0000 | ORAL_TABLET | Freq: Four times a day (QID) | ORAL | 0 refills | Status: AC | PRN

## 2017-10-15 NOTE — Discharge Instructions (Signed)
Start taking blood pressure medication daily.  Please find a family doctor follow-up closely to make sure your blood pressure is well controlled.  Take headache medicine as prescribed as needed.  Follow-up with family doctor.  Return if worsening.

## 2017-10-15 NOTE — ED Triage Notes (Signed)
Patient to ED c/o headaches for the past few days - taken [facility] powders with some relief, but H/A comes back. Hx HTN, used to take medication for it and thinks she needs to be put back on it. Denies N/V, neuro intact.

## 2017-10-15 NOTE — ED Provider Notes (Signed)
[facility] Provider Note   CSN: [id number] Arrival date & time: 10/15/17  1023     History   Chief Complaint Chief Complaint  Patient presents with  . Headache    HPI Tara Parrish is a 27 y.o. female.  HPI Tara Parrish is a 27 y.o. female with hx of pregnancy induced htn, severe preeclmapsia, presents to ED with complaint of headaches.  Patient reports some right-sided headaches every day for the last week.  She reports associated photophobia.  Denies any nausea or vomiting.  States has history of headaches, but never has had them every day for the week.  She has been taking [employer] powder and ibuprofen which helps some.  She states headache was very severe last night to the point where she could not walk or drive.  She states she feels slightly improved today so he decided to come to the ED.  She reports that she has history of pregnancy-induced hypertension and has been told he wound when she was not pregnant since delivering her last child that she had high blood pressure and was started on medications here in the ER, however she states that she is not taking them.  She does not have a primary care doctor.  Does not think she is pregnant.  Past Medical History:  Diagnosis Date  . Chlamydia 11/2010   treated  . Pregnancy induced hypertension   . Prior pregnancy with chronic hypertension and superimposed severe preeclampsia, requiring IOL at 34 weeks 03/15/2014    Patient Active Problem List   Diagnosis Date Noted  . Prior pregnancy with chronic hypertension and superimposed severe preeclampsia, requiring IOL at 34 weeks 03/15/2014  . Hypertension 12/10/2013    Past Surgical History:  Procedure Laterality Date  . WISDOM TOOTH EXTRACTION       OB History    Gravida  3   Para  3   Term  2   Preterm  1   AB  0   Living  3     SAB  0   TAB  0   Ectopic  0   Multiple  0   Live Births  3            Home  Medications    Prior to Admission medications   Medication Sig Start Date End Date Taking? Authorizing Provider  acetaminophen (TYLENOL) 500 MG tablet Take 500 mg by mouth every 6 (six) hours as needed for mild pain.    [provider]  etonogestrel (NEXPLANON) 68 MG IMPL implant 1 each by Subdermal route once.    [provider]  hydrochlorothiazide (HYDRODIURIL) 50 MG tablet Take 1 tablet (50 mg total) by mouth daily. Patient not taking: Reported on 04/13/2017 07/01/16   Mesner, Barbara CowerJason, MD  ondansetron (ZOFRAN) 4 MG tablet Take 1 tablet (4 mg total) by mouth every 8 (eight) hours as needed for nausea or vomiting. 07/01/16   Mesner, Barbara CowerJason, MD    Family History Family History  Problem Relation Age of Onset  . Kidney Stones Mother   . Hypertension Maternal Grandmother     Social History Social History   Tobacco Use  . Smoking status: Former Smoker    Packs/day: 0.25    Years: 1.00    Pack years: 0.25    Last attempt to quit: 09/06/2010    Years since quitting: 7.1  . Smokeless tobacco: Never Used  Substance Use Topics  . Alcohol use: No  .  Drug use: No     Allergies   Bee venom   Review of Systems Review of Systems  Constitutional: Negative for chills and fever.  Respiratory: Negative for cough, chest tightness and shortness of breath.   Cardiovascular: Negative for chest pain, palpitations and leg swelling.  Gastrointestinal: Negative for abdominal pain, diarrhea, nausea and vomiting.  Genitourinary: Negative for dysuria, flank pain, pelvic pain, vaginal bleeding, vaginal discharge and vaginal pain.  Musculoskeletal: Negative for arthralgias, myalgias, neck pain and neck stiffness.  Skin: Negative for rash.  Neurological: Positive for headaches. Negative for dizziness, weakness and light-headedness.  All other systems reviewed and are negative.    Physical Exam Updated Vital Signs BP (!) 182/94 (BP Location: Right Arm)   Pulse 67   Temp 98.7 F  (37.1 C) (Oral)   Resp 18   Ht 5\' 5"  (1.651 m)   Wt 102.1 kg (225 lb)   LMP 10/08/2017 (Exact Date)   SpO2 100%   BMI 37.44 kg/m   Physical Exam  Constitutional: She is oriented to person, place, and time. She appears well-developed and well-nourished. No distress.  HENT:  Head: Normocephalic.  Eyes: Pupils are equal, round, and reactive to light. Conjunctivae and EOM are normal.  Neck: Neck supple.  Cardiovascular: Normal rate, regular rhythm and normal heart sounds.  Pulmonary/Chest: Effort normal and breath sounds normal. No respiratory distress. She has no wheezes. She has no rales.  Abdominal: Soft. Bowel sounds are normal. She exhibits no distension. There is no tenderness. There is no rebound.  Musculoskeletal: She exhibits no edema.  Neurological: She is alert and oriented to person, place, and time. She displays normal reflexes. No cranial nerve deficit. Coordination normal.  Skin: Skin is warm and dry.  Psychiatric: She has a normal mood and affect. Her behavior is normal.  Nursing note and vitals reviewed.    ED Treatments / Results  Labs (all labs ordered are listed, but only abnormal results are displayed) Labs Reviewed  CBC WITH DIFFERENTIAL/PLATELET  I-STAT BETA HCG BLOOD, ED ([facility], [facility], [facility] ONLY)  I-STAT CHEM 8, ED    EKG None  Radiology No results found.  Procedures Procedures (including critical care time)  Medications Ordered in ED Medications - No data to display   Initial Impression / Assessment and Plan / ED Course  I have reviewed the triage vital signs and the nursing notes.  Pertinent labs & imaging results that were available during my care of the patient were reviewed by me and considered in my medical decision making (see chart for details).     Patient emergency department daily headache.  However she states that this time she does not have a headache and feels well.  I will check her CBC to make sure she is not anemic.  We will also  check pregnancy test since she has had a history of severe preeclampsia.  Will restart blood pressure medications.  Normal neurological exam.  No headache at this time.  Do not think she needs any emergent imaging.  Blood pressure is elevated 182/94.  Labs all normal. Not pregnant. Will dc home with HCTZ prescription, fioricet. Headache, being unilateral, severe and debilitating, that comes and goes, with photophobia seems most like possible migraine. Will have her follow up with PCP.   Vitals:   10/15/17 1145 10/15/17 1200 10/15/17 1215 10/15/17 1230  BP: (!) 155/99 134/77 127/79 134/85  Pulse: 62 60 65 65  Resp:      Temp:  TempSrc:      SpO2: 100% 100% 100% 100%  Weight:      Height:         Final Clinical Impressions(s) / ED Diagnoses   Final diagnoses:  Acute nonintractable headache, unspecified headache type  Secondary hypertension    ED Discharge Orders        Ordered    hydrochlorothiazide (HYDRODIURIL) 25 MG tablet  Daily     10/15/17 1243    butalbital-acetaminophen-caffeine (FIORICET, ESGIC) 50-325-40 MG tablet  Every 6 hours PRN     10/15/17 1244       Jaynie Crumble, PA-C 10/15/17 1342    Gwyneth Sprout, MD 10/16/17 1540

## 2017-10-31 ENCOUNTER — Other Ambulatory Visit: Payer: Self-pay

## 2017-10-31 ENCOUNTER — Ambulatory Visit (HOSPITAL_COMMUNITY)
Admission: EM | Admit: 2017-10-31 | Discharge: 2017-10-31 | Disposition: A | Discharge status: Home / Self Care | Payer: Medicaid Other | Attending: Urgent Care | Admitting: Urgent Care

## 2017-10-31 ENCOUNTER — Encounter (HOSPITAL_COMMUNITY): Payer: Self-pay | Admitting: Emergency Medicine

## 2017-10-31 DIAGNOSIS — R51 Headache: Secondary | ICD-10-CM

## 2017-10-31 DIAGNOSIS — I160 Hypertensive urgency: Secondary | ICD-10-CM

## 2017-10-31 DIAGNOSIS — M542 Cervicalgia: Secondary | ICD-10-CM

## 2017-10-31 DIAGNOSIS — R519 Headache, unspecified: Secondary | ICD-10-CM

## 2017-10-31 DIAGNOSIS — R030 Elevated blood-pressure reading, without diagnosis of hypertension: Secondary | ICD-10-CM

## 2017-10-31 MED ORDER — CYCLOBENZAPRINE HCL 10 MG PO TABS: 10.0000 mg | ORAL_TABLET | Freq: Three times a day (TID) | ORAL | 0 refills | Status: DC | PRN
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
## 2017-10-31 NOTE — Discharge Instructions (Addendum)
Do not take any other NSAID including diclofenac, ibuprofen, Advil, naproxen, Aleve, Motrin, etc while your blood pressure is high.    Hydrate well with at least 2 liters (1 gallon) of water daily. You may take 500mg  Tylenol every 6 hours for pain and inflammation.

## 2018-12-19 ENCOUNTER — Other Ambulatory Visit: Payer: Self-pay

## 2018-12-19 ENCOUNTER — Encounter (HOSPITAL_COMMUNITY): Payer: Self-pay | Admitting: Emergency Medicine

## 2018-12-19 ENCOUNTER — Emergency Department (HOSPITAL_COMMUNITY)
Admission: EM | Admit: 2018-12-19 | Discharge: 2018-12-19 | Disposition: A | Discharge status: Home / Self Care | Payer: Medicaid Other | Attending: Emergency Medicine | Admitting: Emergency Medicine

## 2018-12-19 DIAGNOSIS — Z87891 Personal history of nicotine dependence: Secondary | ICD-10-CM | POA: Insufficient documentation

## 2018-12-19 DIAGNOSIS — Z793 Long term (current) use of hormonal contraceptives: Secondary | ICD-10-CM | POA: Insufficient documentation

## 2018-12-19 DIAGNOSIS — R220 Localized swelling, mass and lump, head: Secondary | ICD-10-CM

## 2018-12-19 DIAGNOSIS — Z9114 Patient's other noncompliance with medication regimen: Secondary | ICD-10-CM | POA: Insufficient documentation

## 2018-12-19 DIAGNOSIS — I10 Essential (primary) hypertension: Secondary | ICD-10-CM

## 2018-12-19 DIAGNOSIS — K047 Periapical abscess without sinus: Secondary | ICD-10-CM

## 2018-12-19 MED ORDER — HYDROCHLOROTHIAZIDE 25 MG PO TABS: 25.0000 mg | ORAL_TABLET | Freq: Every day | ORAL | 0 refills | Status: DC

## 2018-12-19 MED ORDER — AMOXICILLIN-POT CLAVULANATE 875-125 MG PO TABS: 1.0000 | ORAL_TABLET | Freq: Once | ORAL | Status: DC

## 2018-12-19 MED ORDER — OXYCODONE-ACETAMINOPHEN 5-325 MG PO TABS: 1.0000 | ORAL_TABLET | Freq: Once | ORAL | Status: DC

## 2018-12-19 MED ORDER — AMOXICILLIN-POT CLAVULANATE 875-125 MG PO TABS: 1.0000 | ORAL_TABLET | Freq: Two times a day (BID) | ORAL | 0 refills | Status: DC

## 2018-12-19 MED FILL — HYDROCHLOROTHIAZIDE 25 MG T: 25 | 30 days supply | Qty: 30 | Fill #0

## 2018-12-19 MED FILL — AMOX-CLAV 875-125 MG TABLET: 875-125 | 7 days supply | Qty: 14 | Fill #0

## 2018-12-19 NOTE — TOC Transition Note (Signed)
Transition of Care [facility]) - CM/SW Discharge Note   Patient Details  Name: Tara Parrish MRN: [id number] Date of Birth: 1991-03-02  [facility] CM/SW Contact:  Fuller Mandril, RN Phone Number: 12/19/2018, 3:09 PM   Clinical Narrative:    [facility] met with pt at bedside.  Pt tearful and very angry for her wait.  EDCM explained process of MATCH and obtaining Rx from [facility].   Final next level of care: Home/Self Care Barriers to Discharge: Inadequate or no insurance   Patient Goals and CMS Choice Patient states their goals for this hospitalization and ongoing recovery are:: get something for my face swelling      Discharge Placement                       Discharge Plan and Services   Discharge Planning Services: CM Consult, [place] Program, Medication Assistance, Follow-up appt scheduled                 ED CM consulted by EDP Phylliss Bob for medication assistance. EDCM reviewed chart and spoke with the pt about [facility] MATCH program ($3 co pay for each Rx through [facility] program, does not include refills, 7 day expiration of [place] letter and choice of pharmacies). Pt is eligible for [facility] MATCH program (unable to find pt listed in PROCARE per cardholder name inquiry) and has agreed to accept [facility] but did not have funds to purchase.  EDCM placed override on Rx. PROCARE information entered. EDCM went to [facility] to obtain Rx and delivered to pt.   EDP delivered AVS as EDCM turning over Rx.  EDCM also confirmed that pt does not have PCP. NCM discussed and provided appointment date and time.                Social Determinants of Health (SDOH) Interventions     Readmission Risk Interventions No flowsheet data found.

## 2018-12-19 NOTE — ED Triage Notes (Signed)
Pt reports facial swelling that started yesterday and has gotten worse since then.

## 2018-12-19 NOTE — ED Provider Notes (Signed)
[facility] Provider Note   CSN: [id number] Arrival date & time: 12/19/18  [id number]    History   Chief Complaint Chief Complaint  Patient presents with  . Facial Swelling    HPI Tara Parrish is a 28 y.o. female with past medical history of hypertension, who presents today for evaluation of left-sided facial swelling.  She reports that since last night she has developed worsening pain and swelling in the left side of her face.  She reports that it feels like a throbbing pain.  She has tried ibuprofen and Tylenol at home without relief.  She reports that she has had similar in the past and that the pain is making her teeth hurt.  She states that she thinks this is her sinuses, however denies stuffy nose, runny nose, recent cold or URI like symptoms.  She does report that her teeth are in generally poor state.  She has not seen a dentist recently.  He states that she does not currently have insurance and so she is unable to see a dentist.  She denies any fevers or new exposures.  She denies any foul taste in her mouth.  She also reports that she is not taking any blood pressure medicines.  She has been on HCTZ in the past however reports that she has not been taking it as she has a difficult time affording her medications and "just do not care, I have been here long enough and want to go home."  Reports that she lost her Medicaid and does not think that she will be able to afford her antibiotics.  She states that she is upset that there is not anything that I can do to make the swelling improve right now and that it may take 1 to 2 days for antibiotics to improve the swelling.  She denies any possibility of pregnancy adamantly.  She denies any chest pain, headache, or shortness of breath.     HPI  Past Medical History:  Diagnosis Date  . Chlamydia 11/2010   treated  . Pregnancy induced hypertension   . Prior pregnancy with chronic hypertension and  superimposed severe preeclampsia, requiring IOL at 34 weeks 03/15/2014    Patient Active Problem List   Diagnosis Date Noted  . Prior pregnancy with chronic hypertension and superimposed severe preeclampsia, requiring IOL at 34 weeks 03/15/2014  . Hypertension 12/10/2013    Past Surgical History:  Procedure Laterality Date  . WISDOM TOOTH EXTRACTION       OB History    Gravida  3   Para  3   Term  2   Preterm  1   AB  0   Living  3     SAB  0   TAB  0   Ectopic  0   Multiple  0   Live Births  3            Home Medications    Prior to Admission medications   Medication Sig Start Date End Date Taking? Authorizing Provider  acetaminophen (TYLENOL) 500 MG tablet Take 500 mg by mouth every 6 (six) hours as needed for mild pain.    [provider]  amoxicillin-clavulanate (AUGMENTIN) 875-125 MG tablet Take 1 tablet by mouth every 12 (twelve) hours. 12/19/18   Cristina GongHammond, Montina Dorrance W, PA-C  cyclobenzaprine (FLEXERIL) 10 MG tablet Take 1 tablet (10 mg total) by mouth 3 (three) times daily as needed for muscle spasms. 10/31/17  Jaynee Eagles, PA-C  etonogestrel (NEXPLANON) 68 MG IMPL implant 1 each by Subdermal route once.    [provider]  hydrochlorothiazide (HYDRODIURIL) 25 MG tablet Take 1 tablet (25 mg total) by mouth daily. 12/19/18 01/18/19  Lorin Glass, PA-C  ondansetron (ZOFRAN) 4 MG tablet Take 1 tablet (4 mg total) by mouth every 8 (eight) hours as needed for nausea or vomiting. 07/01/16   Mesner, Corene Cornea, MD    Family History Family History  Problem Relation Age of Onset  . Kidney Stones Mother   . Hypertension Maternal Grandmother     Social History Social History   Tobacco Use  . Smoking status: Former Smoker    Packs/day: 0.25    Years: 1.00    Pack years: 0.25    Quit date: 09/06/2010    Years since quitting: 8.2  . Smokeless tobacco: Never Used  Substance Use Topics  . Alcohol use: No  . Drug use: No     Allergies    Bee venom and Grass extracts [gramineae pollens]   Review of Systems Review of Systems  Constitutional: Negative for chills and fever.  HENT: Positive for dental problem and facial swelling. Negative for drooling, ear pain, mouth sores, sinus pressure, sinus pain, sore throat, trouble swallowing and voice change.   Respiratory: Negative for chest tightness and shortness of breath.   Gastrointestinal: Negative for abdominal pain, nausea and vomiting.  Neurological: Negative for weakness and headaches.  All other systems reviewed and are negative.    Physical Exam Updated Vital Signs BP (!) 206/104   Pulse 63   Temp 98.6 F (37 C) (Oral)   Resp 20   Ht 5\' 5"  (1.651 m)   Wt 102.1 kg   LMP  (Exact Date)   SpO2 98%   BMI 37.44 kg/m   Physical Exam Vitals signs and nursing note reviewed.  Constitutional:      General: She is not in acute distress.    Appearance: She is well-developed.  HENT:     Head: Normocephalic and atraumatic.     Ears:     Comments: Patient refused    Nose: Nose normal. No congestion or rhinorrhea.     Mouth/Throat:     Mouth: Mucous membranes are moist.     Comments: Teeth are in generally poor state with multiple dental cavities present.  There is no specific pain with palpation of oral mucosa and no intraoral swelling.  Uvula is midline.  She does not have trismus.  There is mild left-sided facial swelling primarily at the level of the maxillary teeth on the left side.  Palpation here both re-creates and exacerbates her reported pain. No abnormal redness or fluctuance.  No swelling below the jawline or elevation of the floor the mouth. Eyes:     Conjunctiva/sclera: Conjunctivae normal.  Neck:     Musculoskeletal: Normal range of motion and neck supple. No neck rigidity or muscular tenderness.  Cardiovascular:     Rate and Rhythm: Normal rate and regular rhythm.     Pulses: Normal pulses.     Heart sounds: Normal heart sounds. No murmur.  Pulmonary:      Effort: Pulmonary effort is normal. No respiratory distress.     Breath sounds: Normal breath sounds. No stridor.  Abdominal:     General: Abdomen is flat. There is no distension.     Palpations: Abdomen is soft.     Tenderness: There is no abdominal tenderness.  Musculoskeletal:  Right lower leg: No edema.     Left lower leg: No edema.  Lymphadenopathy:     Cervical: No cervical adenopathy.  Skin:    General: Skin is warm and dry.  Neurological:     General: No focal deficit present.     Mental Status: She is alert.     Cranial Nerves: No cranial nerve deficit.  Psychiatric:        Attention and Perception: She does not perceive auditory or visual hallucinations.        Mood and Affect: Mood is anxious. Affect is tearful.        Behavior: Behavior is agitated.        Thought Content: Thought content does not include homicidal or suicidal ideation. Thought content does not include homicidal or suicidal plan.     Comments: Patient is tearful, expresses frustration of not having a primary care doctor and of waiting time.      ED Treatments / Results  Labs (all labs ordered are listed, but only abnormal results are displayed) Labs Reviewed - No data to display  EKG None  Radiology No results found.  Procedures Procedures (including critical care time)  Medications Ordered in ED Medications - No data to display   Initial Impression / Assessment and Plan / ED Course  I have reviewed the triage vital signs and the nursing notes.  Pertinent labs & imaging results that were available during my care of the patient were reviewed by me and considered in my medical decision making (see chart for details).       Patient with toothache.  No gross abscess.  There is exam unconcerning for Ludwig's angina or spread of infection.  Patient is adamant her symptoms are related due to a sinus issue, however she does not endorse any rhinorrhea, nasal congestion or sinus pain or  recent illness or sickness.  She will be treated with Augmentin to cover both issues.  She is given instructions on over-the-counter ibuprofen and Tylenol.  1 time dose of Percocet here was ordered however patient left before that could be administered.  She is given Designer, jewellerydental resource guide.  She is also significantly hypertensive while in the emergency room.  She has a longstanding history of hypertension and noncompliance with her medications.  She used to be on HCTZ.  This will be restarted.  I spoke with case management who came to see the patient and was able to [facility].  Case management personally delivered the prescriptions to patient for her antibiotics and HCTZ.    I informed patient that the medical advice would be for evaluation of her high blood pressure.  We discussed that high blood pressure is causing damage even if it is not felt and that with her blood pressure being so high she is at risk of significant complications.  We discussed the recommended medical evaluation and she made the informed decision to refuse this treatment stating that she had been here "long enough" and that she wished to go home.  I attempted to persuade her to get labs, and additional evaluation which she continued to refuse.  She was consistently agitated and tearful during evaluation.  She denies any headache, chest pain or shortness of breath.  Will restart her home blood pressure medicines.  Case management was able to get her a appointment with [facility].    Patient made informed medical decision to decline recommended evaluation AGAINST MEDICAL ADVICE.  Final Clinical Impressions(s) / ED Diagnoses   Final diagnoses:  Facial swelling  Dental infection  Essential hypertension    ED Discharge Orders         Ordered    amoxicillin-clavulanate (AUGMENTIN) 875-125 MG tablet  Every 12 hours     12/19/18 1430    hydrochlorothiazide (HYDRODIURIL) 25 MG  tablet  Daily     12/19/18 1430           Cristina GongHammond, Leighton Brickley W, [place] JerseyPA-C 12/19/18 Bennie [id number]    Geoffery Lyonselo, [place], MD 12/20/18 1304

## 2018-12-19 NOTE — TOC Initial Note (Signed)
Transition of Care [facility]) - Initial/Assessment Note    Patient Details  Name: Tara Parrish MRN: [id number] Date of Birth: 09/15/1990  [facility] CM/SW Contact:    Fuller Mandril, RN Phone Number: 12/19/2018, 3:08 PM  Clinical Narrative:                 [facility] consulted regarding medication and PCP assistance.  Expected Discharge Plan: Home/Self Care Barriers to Discharge: Inadequate or no insurance   Patient Goals and CMS Choice Patient states their goals for this hospitalization and ongoing recovery are:: get something for my face swelling      Expected Discharge Plan and Services Expected Discharge Plan: Home/Self Care   Discharge Planning Services: CM Consult, MATCH Program, Medication Assistance, Follow-up appt scheduled   Living arrangements for the past 2 months: Apartment Expected Discharge Date: 12/19/18                                    Prior Living Arrangements/Services Living arrangements for the past 2 months: Apartment Lives with:: Self, Minor Children                   Activities of Daily Living      Permission Sought/Granted                  Emotional Assessment Appearance:: Appears stated age Attitude/Demeanor/Rapport: Angry, Irrational, Crying Affect (typically observed): Tearful/Crying, Irritable, Angry Orientation: : Oriented to Self, Oriented to Place, Oriented to  Time, Oriented to Situation Alcohol / Substance Use: Tobacco Use Psych Involvement: No (comment)  Admission diagnosis:  facial swelling Patient Active Problem List   Diagnosis Date Noted  . Prior pregnancy with chronic hypertension and superimposed severe preeclampsia, requiring IOL at 34 weeks 03/15/2014  . Hypertension 12/10/2013   PCP:  System, Pcp Not In Pharmacy:   [facility] [place], [address] [place] [address] Phone: [phone] Fax:  [phone]  [facility] [phone] - [place], [place] - [address] [place] [address] Phone: [phone] Fax: [phone]  [facility] [place], [place] - [address] [place] [address] Phone: [phone] Fax: [phone]     Social Determinants of Health (SDOH) Interventions    Readmission Risk Interventions No flowsheet data found.

## 2018-12-19 NOTE — Discharge Instructions (Addendum)
Please take Ibuprofen (Advil, motrin) and Tylenol (acetaminophen) to relieve your pain.  You may take up to 600 MG (3 pills) of normal strength ibuprofen every 8 hours as needed.  In between doses of ibuprofen you make take tylenol, up to 1,000 mg (two extra strength pills).  Do not take more than 3,000 mg tylenol in a 24 hour period.  Please check all medication labels as many medications such as pain and cold medications may contain tylenol.  Do not drink alcohol while taking these medications.  Do not take other NSAID'S while taking ibuprofen (such as aleve or naproxen).  Please take ibuprofen with food to decrease stomach upset.  You may have diarrhea from the antibiotics.  It is very important that you continue to take the antibiotics even if you get diarrhea unless a medical professional tells you that you may stop taking them.  If you stop too early the bacteria you are being treated for will become stronger and you may need different, more powerful antibiotics that have more side effects and worsening diarrhea.  Please stay well hydrated and consider probiotics as they may decrease the severity of your diarrhea.  Please be aware that if you take any hormonal contraception (birth control pills, nexplanon, the ring, etc) that your birth control will not work while you are taking antibiotics and you need to use back up protection as directed on the birth control medication information insert.   Today you received medications that may make you sleepy or impair your ability to make decisions.  For the next 24 hours please do not drive, operate heavy machinery, care for a small child with out another adult present, or perform any activities that may cause harm to you or someone else if you were to fall asleep or be impaired.   Today your blood pressure was very high.  I recommended additional evaluation which you refused.  Please keep your appointment with [facility] if you are unable to keep this  appointment and you must call them to schedule a new appointment.  Failure to treat your blood pressure can result in significant complications including kidney failure, heart attack, stroke and ultimately death.

## 2018-12-19 NOTE — ED Notes (Signed)
Pt left without repating her VS and without her meds that were ordered , PA at bedside and gave patient her discharge papers

## 2019-01-01 ENCOUNTER — Inpatient Hospital Stay (INDEPENDENT_AMBULATORY_CARE_PROVIDER_SITE_OTHER): Payer: Medicaid Other | Admitting: Primary Care

## 2019-01-10 ENCOUNTER — Inpatient Hospital Stay (INDEPENDENT_AMBULATORY_CARE_PROVIDER_SITE_OTHER): Payer: Medicaid Other | Admitting: Primary Care

## 2019-12-28 ENCOUNTER — Other Ambulatory Visit: Payer: Self-pay

## 2019-12-28 ENCOUNTER — Encounter (HOSPITAL_COMMUNITY): Payer: Self-pay | Admitting: Emergency Medicine

## 2019-12-28 ENCOUNTER — Emergency Department (HOSPITAL_COMMUNITY)
Admission: EM | Admit: 2019-12-28 | Discharge: 2019-12-29 | Disposition: A | Discharge status: Home / Self Care | Payer: Medicaid Other | Attending: Emergency Medicine | Admitting: Emergency Medicine

## 2019-12-28 DIAGNOSIS — Z5321 Procedure and treatment not carried out due to patient leaving prior to being seen by health care provider: Secondary | ICD-10-CM | POA: Insufficient documentation

## 2019-12-28 DIAGNOSIS — R55 Syncope and collapse: Secondary | ICD-10-CM | POA: Insufficient documentation

## 2019-12-28 NOTE — ED Triage Notes (Signed)
Patient arrives complaining of near-syncopal episode while at work. Patient states increased stress in her life and she is unable to afford her BP medications. Patient states decreased oral intake due to stress. Patient also around ovens and states she could also have been overheated. Patient states symptoms have resolved now. Patient states this is the second time in 2 weeks that this has occurred.

## 2019-12-29 NOTE — ED Notes (Signed)
Patient gave registration her stickers and left

## 2020-04-24 ENCOUNTER — Ambulatory Visit (INDEPENDENT_AMBULATORY_CARE_PROVIDER_SITE_OTHER): Payer: Medicaid Other | Admitting: Family Medicine

## 2020-04-24 ENCOUNTER — Other Ambulatory Visit: Payer: Self-pay

## 2020-04-24 ENCOUNTER — Encounter: Payer: Self-pay | Admitting: Family Medicine

## 2020-04-24 ENCOUNTER — Other Ambulatory Visit (HOSPITAL_COMMUNITY)
Admission: RE | Admit: 2020-04-24 | Discharge: 2020-04-24 | Disposition: A | Discharge status: Home / Self Care | Payer: Medicaid Other | Source: Ambulatory Visit | Attending: Family Medicine | Admitting: Family Medicine

## 2020-04-24 VITALS — BP 157/103 | HR 69 | Ht 65.0 in | Wt 213.0 lb

## 2020-04-24 DIAGNOSIS — Z3046 Encounter for surveillance of implantable subdermal contraceptive: Secondary | ICD-10-CM | POA: Diagnosis not present

## 2020-04-24 DIAGNOSIS — Z23 Encounter for immunization: Secondary | ICD-10-CM | POA: Diagnosis not present

## 2020-04-24 DIAGNOSIS — Z30017 Encounter for initial prescription of implantable subdermal contraceptive: Secondary | ICD-10-CM

## 2020-04-24 DIAGNOSIS — I10 Essential (primary) hypertension: Secondary | ICD-10-CM

## 2020-04-24 DIAGNOSIS — Z01419 Encounter for gynecological examination (general) (routine) without abnormal findings: Secondary | ICD-10-CM | POA: Insufficient documentation

## 2020-04-24 DIAGNOSIS — A599 Trichomoniasis, unspecified: Secondary | ICD-10-CM

## 2020-04-24 DIAGNOSIS — Z113 Encounter for screening for infections with a predominantly sexual mode of transmission: Secondary | ICD-10-CM

## 2020-04-24 MED ORDER — HYDROCHLOROTHIAZIDE 25 MG PO TABS: 25.0000 mg | ORAL_TABLET | Freq: Every day | ORAL | 5 refills | Status: DC

## 2020-04-24 MED ORDER — ETONOGESTREL 68 MG ~~LOC~~ IMPL
68.0000 mg | DRUG_IMPLANT | Freq: Once | SUBCUTANEOUS | Status: AC
  Administered 2020-04-24: 68 mg via SUBCUTANEOUS

## 2020-04-24 NOTE — Addendum Note (Signed)
Addended by: Maxwell Marion E on: 04/24/2020 01:30 PM   Modules accepted: Orders

## 2020-04-24 NOTE — Assessment & Plan Note (Signed)
Young adult in moderately good health Pap today, no other cancer screening due, no other family hx of cancer Nexplanon removal/insertion today for contraception Accepts STI screening No indication for metabolic screening currently PHQ-9 elevated, offered BH/medication, declines at this time but aware we are always available to support her

## 2020-04-24 NOTE — Progress Notes (Signed)
     GYNECOLOGY OFFICE PROCEDURE NOTE  Tara Parrish is a 29 y.o. [phone] here for Nexplanon removal and insertion.  Last pap smear was on 04/13/2017 and was normal.   Nexplanon removal and insertion Procedure Patient identified, informed consent performed, consent signed.   Patient does understand that irregular bleeding is a very common side effect of this medication. She was advised to have backup contraception for one week after replacement of the implant.  Appropriate time out taken. Nexplanon site identified. Area prepped in usual sterile fashon. One ml of 1% lidocaine was used to anesthetize the area at the distal end of the implant. A small stab incision was made right beside the implant on the distal portion. The Nexplanon rod was grasped using hemostats and removed with some difficulty. There was minimal blood loss. There were no complications. Area was then injected with 3 ml of 1 % lidocaine. She was re-prepped with betadine, Nexplanon removed from packaging, Device confirmed in needle, then inserted full length of needle and withdrawn per handbook instructions. Nexplanon was able to palpated in the patient's arm; patient palpated the insert herself.  There was minimal blood loss. Patient insertion site covered with guaze and a pressure bandage to reduce any bruising. The patient tolerated the procedure well and was given post procedure instructions.  She was advised to have backup contraception for one week.    Venora Maples, MD/[facility] [employer], [facility]

## 2020-04-24 NOTE — Patient Instructions (Signed)
Nexplanon Instructions After Insertion   Keep bandage clean and dry for 24 hours   May use ice/Tylenol/Ibuprofen for soreness or pain   If you develop fever, drainage or increased warmth from incision site-contact office immediately     Preventive Care 28-28 Years Old, Female Preventive care refers to visits with your health care provider and lifestyle choices that can promote health and wellness. This includes:  A yearly physical exam. This may also be called an annual well check.  Regular dental visits and eye exams.  Immunizations.  Screening for certain conditions.  Healthy lifestyle choices, such as eating a healthy diet, getting regular exercise, not using drugs or products that contain nicotine and tobacco, and limiting alcohol use. What can I expect for my preventive care visit? Physical exam Your health care provider will check your:  Height and weight. This may be used to calculate body mass index (BMI), which tells if you are at a healthy weight.  Heart rate and blood pressure.  Skin for abnormal spots. Counseling Your health care provider may ask you questions about your:  Alcohol, tobacco, and drug use.  Emotional well-being.  Home and relationship well-being.  Sexual activity.  Eating habits.  Work and work Statistician.  Method of birth control.  Menstrual cycle.  Pregnancy history. What immunizations do I need?  Influenza (flu) vaccine  This is recommended every year. Tetanus, diphtheria, and pertussis (Tdap) vaccine  You may need a Td booster every 10 years. Varicella (chickenpox) vaccine  You may need this if you have not been vaccinated. Human papillomavirus (HPV) vaccine  If recommended by your health care provider, you may need three doses over 6 months. Measles, mumps, and rubella (MMR) vaccine  You may need at least one dose of MMR. You may also need a second dose. Meningococcal conjugate (MenACWY) vaccine  One dose is  recommended if you are age 28-21 years and a first-year college student living in a residence hall, or if you have one of several medical conditions. You may also need additional booster doses. Pneumococcal conjugate (PCV13) vaccine  You may need this if you have certain conditions and were not previously vaccinated. Pneumococcal polysaccharide (PPSV23) vaccine  You may need one or two doses if you smoke cigarettes or if you have certain conditions. Hepatitis A vaccine  You may need this if you have certain conditions or if you travel or work in places where you may be exposed to hepatitis A. Hepatitis B vaccine  You may need this if you have certain conditions or if you travel or work in places where you may be exposed to hepatitis B. Haemophilus influenzae type b (Hib) vaccine  You may need this if you have certain conditions. You may receive vaccines as individual doses or as more than one vaccine together in one shot (combination vaccines). Talk with your health care provider about the risks and benefits of combination vaccines. What tests do I need?  Blood tests  Lipid and cholesterol levels. These may be checked every 5 years starting at age 17.  Hepatitis C test.  Hepatitis B test. Screening  Diabetes screening. This is done by checking your blood sugar (glucose) after you have not eaten for a while (fasting).  Sexually transmitted disease (STD) testing.  BRCA-related cancer screening. This may be done if you have a family history of breast, ovarian, tubal, or peritoneal cancers.  Pelvic exam and Pap test. This may be done every 3 years starting at age 28. Starting  at age 40, this may be done every 5 years if you have a Pap test in combination with an HPV test. Talk with your health care provider about your test results, treatment options, and if necessary, the need for more tests. Follow these instructions at home: Eating and drinking   Eat a diet that includes fresh  fruits and vegetables, whole grains, lean protein, and low-fat dairy.  Take vitamin and mineral supplements as recommended by your health care provider.  Do not drink alcohol if: ? Your health care provider tells you not to drink. ? You are pregnant, may be pregnant, or are planning to become pregnant.  If you drink alcohol: ? Limit how much you have to 0-1 drink a day. ? Be aware of how much alcohol is in your drink. In the [place] one drink equals one 12 oz bottle of beer (355 mL), one 5 oz glass of wine (148 mL), or one 1 oz glass of hard liquor (44 mL). Lifestyle  Take daily care of your teeth and gums.  Stay active. Exercise for at least 30 minutes on 5 or more days each week.  Do not use any products that contain nicotine or tobacco, such as cigarettes, e-cigarettes, and chewing tobacco. If you need help quitting, ask your health care provider.  If you are sexually active, practice safe sex. Use a condom or other form of birth control (contraception) in order to prevent pregnancy and STIs (sexually transmitted infections). If you plan to become pregnant, see your health care provider for a preconception visit. What's next?  Visit your health care provider once a year for a well check visit.  Ask your health care provider how often you should have your eyes and teeth checked.  Stay up to date on all vaccines. This information is not intended to replace advice given to you by your health care provider. Make sure you discuss any questions you have with your health care provider. Document Revised: 12/29/2017 Document Reviewed: 12/29/2017 Elsevier Patient Education  2020 [employer].

## 2020-04-24 NOTE — Assessment & Plan Note (Signed)
Uncontrolled BP at this visit. Will restart HCTZ, given printout for good rx coupon, will check BMP in one week to ensure no electrolyte derangements.

## 2020-04-24 NOTE — Progress Notes (Signed)
GYNECOLOGY OFFICE VISIT NOTE  History:   Tara Parrish is a 29 y.o. [phone] here today for annual wellness visit.  Patient with hx of high blood pressure Reports she has not taken her meds for some time  Had nexplanon placed 3 years ago Would like it removed and replaced  Last pap 3 years ago at time of nexplanon removal/replacement, was normal at that time  Elevated PHQ-9, denies SI/HI  Past Medical History:  Diagnosis Date  . Chlamydia 11/2010   treated  . Pregnancy induced hypertension   . Prior pregnancy with chronic hypertension and superimposed severe preeclampsia, requiring IOL at 34 weeks 03/15/2014    Past Surgical History:  Procedure Laterality Date  . WISDOM TOOTH EXTRACTION      The following portions of the patient's history were reviewed and updated as appropriate: allergies, current medications, past family history, past medical history, past social history, past surgical history and problem list.   Health Maintenance:  Normal pap and negative HRHPV: 04/13/2017.  Normal mammogram: n/a.   Review of Systems:  Pertinent items noted in HPI and remainder of comprehensive ROS otherwise negative.  Physical Exam:  BP (!) 157/103   Pulse 69   Ht 5\' 5"  (1.651 m)   Wt 213 lb (96.6 kg)   LMP 03/26/2020 (Within Days)   BMI 35.45 kg/m  CONSTITUTIONAL: Well-developed, well-nourished female in no acute distress.  HEENT:  Normocephalic, atraumatic. External right and left ear normal. No scleral icterus.  NECK: Normal range of motion, supple, no masses noted on observation SKIN: No rash noted. Not diaphoretic. No erythema. No pallor. MUSCULOSKELETAL: Normal range of motion. No edema noted. NEUROLOGIC: Alert and oriented to person, place, and time. Normal muscle tone coordination.  PSYCHIATRIC: Normal mood and affect. Normal behavior. Normal judgment and thought content. RESPIRATORY: Effort normal, no problems with respiration noted PELVIC: Normal appearing  external genitalia; normal appearing vaginal mucosa and cervix.  No abnormal discharge noted.    Labs and Imaging No results found for this or any previous visit (from the past 168 hour(s)). No results found.    Assessment and Plan:   Problem List Items Addressed This Visit      Cardiovascular and Mediastinum   Hypertension    Uncontrolled BP at this visit. Will restart HCTZ, given printout for good rx coupon, will check BMP in one week to ensure no electrolyte derangements.       Relevant Medications   hydrochlorothiazide (HYDRODIURIL) 25 MG tablet   Other Relevant Orders   Ambulatory referral to [facility]   Basic Metabolic Panel (BMET)     Other   Well woman exam with routine gynecological exam - Primary    Young adult in moderately good health Pap today, no other cancer screening due, no other family hx of cancer Nexplanon removal/insertion today for contraception Accepts STI screening No indication for metabolic screening currently PHQ-9 elevated, offered BH/medication, declines at this time but aware we are always available to support her      Relevant Orders   Cytology - PAP( [place])    Other Visit Diagnoses    Flu vaccine need       Relevant Orders   Flu Vaccine QUAD 36+ mos IM (Completed)   Screening for STD (sexually transmitted disease)       Relevant Orders   HIV antibody (with reflex)   Hepatitis B Surface AntiGEN   Hepatitis C Antibody   RPR      Routine preventative  health maintenance measures emphasized. Please refer to After Visit Summary for other counseling recommendations.   Return in about 1 year (around 04/24/2021) for Annual Wellness Visit.    Total face-to-face time with patient: 25 minutes.  Over 50% of encounter was spent on counseling and coordination of care.   Venora Maples, MD/[facility] [employer], [facility]

## 2020-04-25 LAB — HIV ANTIBODY (ROUTINE TESTING W REFLEX): HIV Screen 4th Generation wRfx: NONREACTIVE

## 2020-04-25 LAB — HEPATITIS B SURFACE ANTIGEN: Hepatitis B Surface Ag: NEGATIVE

## 2020-04-25 LAB — RPR: RPR Ser Ql: NONREACTIVE

## 2020-04-25 LAB — HEPATITIS C ANTIBODY: Hep C Virus Ab: <0.1 s/co ratio (ref 0.0–0.9)

## 2020-04-28 LAB — CYTOLOGY - PAP
Chlamydia: NEGATIVE
Comment: NEGATIVE
Comment: NEGATIVE
Comment: NORMAL
Diagnosis: NEGATIVE
Neisseria Gonorrhea: NEGATIVE
Trichomonas: POSITIVE — AB

## 2020-04-28 MED ORDER — METRONIDAZOLE 500 MG PO TABS: 500.0000 mg | ORAL_TABLET | Freq: Two times a day (BID) | ORAL | 0 refills | Status: AC

## 2020-04-28 NOTE — Addendum Note (Signed)
Addended by: Merian Capron on: 04/28/2020 02:49 PM   Modules accepted: Orders

## 2020-04-29 ENCOUNTER — Telehealth: Payer: Self-pay | Admitting: Lactation Services

## 2020-04-29 NOTE — Telephone Encounter (Signed)
-----   Message from Venora Maples, MD sent at 04/28/2020  2:48 PM EST ----- Patient notified of normal pap and +trich by MyChart, rx sent to pharmacy for Flagyl.  FYI to clinical pool in case she calls for expedited partner treatment

## 2020-04-29 NOTE — Telephone Encounter (Signed)
Called patient to inform her of swab results + for Trich. Reviewed partner needs to be notified of active infection and to call the [facility] Department for treatment. Reviewed abstaining from intercourse for 2 weeks after treatment started. Reviewed with patient it is recommended that she come to the office in 3-4 weeks for TOC. Patient voiced understanding. Patient to call with any questions or concerns as needed.

## 2020-05-01 ENCOUNTER — Other Ambulatory Visit: Payer: Medicaid Other

## 2020-05-01 ENCOUNTER — Other Ambulatory Visit: Payer: Self-pay

## 2020-05-01 DIAGNOSIS — I10 Essential (primary) hypertension: Secondary | ICD-10-CM

## 2020-05-02 LAB — BASIC METABOLIC PANEL
BUN/Creatinine Ratio: 5 — ABNORMAL LOW (ref 9–23)
BUN: 5 mg/dL — ABNORMAL LOW (ref 6–20)
CO2: 22 mmol/L (ref 20–29)
Calcium: 9.8 mg/dL (ref 8.7–10.2)
Chloride: 104 mmol/L (ref 96–106)
Creatinine, Ser: 0.91 mg/dL (ref 0.57–1.00)
GFR calc Af Amer: 99 mL/min/{1.73_m2} (ref >59)
GFR calc non Af Amer: 86 mL/min/{1.73_m2} (ref >59)
Glucose: 88 mg/dL (ref 65–99)
Potassium: 4.2 mmol/L (ref 3.5–5.2)
Sodium: 138 mmol/L (ref 134–144)

## 2020-05-19 ENCOUNTER — Encounter: Payer: Self-pay | Admitting: *Deleted

## 2020-05-19 NOTE — Progress Notes (Signed)
Spoke with patient via phone.  Appointment 05/20/20 at 8:30 am.  Explained office opening at 10 am due to inclement weather.  Patient requests to reschedule.  Appointment rescheduled to 05/21/20 at 2 pm.  Patient agreeable to date/time change of appointment.

## 2020-05-20 ENCOUNTER — Ambulatory Visit: Payer: Medicaid Other

## 2020-05-21 ENCOUNTER — Other Ambulatory Visit: Payer: Self-pay

## 2020-05-21 ENCOUNTER — Other Ambulatory Visit (HOSPITAL_COMMUNITY)
Admission: RE | Admit: 2020-05-21 | Discharge: 2020-05-21 | Disposition: A | Discharge status: Home / Self Care | Payer: Medicaid Other | Source: Ambulatory Visit | Attending: Family Medicine | Admitting: Family Medicine

## 2020-05-21 ENCOUNTER — Ambulatory Visit (INDEPENDENT_AMBULATORY_CARE_PROVIDER_SITE_OTHER): Payer: Self-pay | Admitting: *Deleted

## 2020-05-21 VITALS — BP 158/96 | HR 70 | Ht 65.0 in | Wt 214.6 lb

## 2020-05-21 DIAGNOSIS — Z8619 Personal history of other infectious and parasitic diseases: Secondary | ICD-10-CM | POA: Insufficient documentation

## 2020-05-21 NOTE — Progress Notes (Signed)
Chart reviewed for nurse visit. Agree with plan of care.   Tara Maples, MD 05/21/20 3:58 PM

## 2020-05-21 NOTE — Progress Notes (Signed)
Here for test of cure . Was treated for trichomoniasis in December. Verified she completed flagyl for 7 days. Self swab obtained. Linda,RN

## 2020-05-22 LAB — CERVICOVAGINAL ANCILLARY ONLY
Comment: NEGATIVE
Trichomonas: NEGATIVE

## 2020-06-25 ENCOUNTER — Other Ambulatory Visit: Payer: Self-pay

## 2020-06-25 ENCOUNTER — Encounter (INDEPENDENT_AMBULATORY_CARE_PROVIDER_SITE_OTHER): Payer: Self-pay | Admitting: Primary Care

## 2020-06-25 ENCOUNTER — Ambulatory Visit (INDEPENDENT_AMBULATORY_CARE_PROVIDER_SITE_OTHER): Payer: Self-pay | Admitting: Primary Care

## 2020-06-25 VITALS — BP 132/91 | HR 79 | Temp 97.3°F | Ht 65.0 in | Wt 213.8 lb

## 2020-06-25 DIAGNOSIS — E66812 Obesity, class 2: Secondary | ICD-10-CM

## 2020-06-25 DIAGNOSIS — I10 Essential (primary) hypertension: Secondary | ICD-10-CM

## 2020-06-25 DIAGNOSIS — Z6835 Body mass index (BMI) 35.0-35.9, adult: Secondary | ICD-10-CM

## 2020-06-25 DIAGNOSIS — F17200 Nicotine dependence, unspecified, uncomplicated: Secondary | ICD-10-CM

## 2020-06-25 DIAGNOSIS — Z7689 Persons encountering health services in other specified circumstances: Secondary | ICD-10-CM

## 2020-06-25 DIAGNOSIS — E6609 Other obesity due to excess calories: Secondary | ICD-10-CM

## 2020-06-25 DIAGNOSIS — N926 Irregular menstruation, unspecified: Secondary | ICD-10-CM

## 2020-06-25 MED ORDER — AMLODIPINE BESYLATE 10 MG PO TABS: 10.0000 mg | ORAL_TABLET | Freq: Every day | ORAL | 3 refills | Status: AC

## 2020-06-25 MED ORDER — HYDROCHLOROTHIAZIDE 25 MG PO TABS: 25.0000 mg | ORAL_TABLET | Freq: Every day | ORAL | 1 refills | Status: AC

## 2020-06-25 NOTE — Progress Notes (Signed)
New Patient Office Visit  Subjective:  Patient ID: Tara Parrish, female    DOB: 1990/08/15  Age: 30 y.o. MRN: [id number]  CC:  Chief Complaint  Patient presents with  . New Patient (Initial Visit)    HPI [name] is a 30 years old who presents for establishment care and management of HTN Denies shortness of breath, headaches, chest pain or lower extremity edema.  Past Medical History:  Diagnosis Date  . Chlamydia 11/2010   treated  . Pregnancy induced hypertension   . Prior pregnancy with chronic hypertension and superimposed severe preeclampsia, requiring IOL at 34 weeks 03/15/2014    Past Surgical History:  Procedure Laterality Date  . WISDOM TOOTH EXTRACTION      Family History  Problem Relation Age of Onset  . Kidney Stones Mother   . Hypertension Maternal Grandmother     Social History   Socioeconomic History  . Marital status: Single    Spouse name: Not on file  . Number of children: Not on file  . Years of education: Not on file  . Highest education level: Not on file  Occupational History  . Not on file  Tobacco Use  . Smoking status: Current Every Day Smoker    Packs/day: 0.50    Years: 1.00    Pack years: 0.50    Types: Cigarettes  . Smokeless tobacco: Never Used  Substance and Sexual Activity  . Alcohol use: No  . Drug use: No  . Sexual activity: Yes    Birth control/protection: Condom, Implant  Other Topics Concern  . Not on file  Social History Narrative  . Not on file   Social Determinants of Health   Financial Resource Strain: Not on file  Food Insecurity: Food Insecurity Present  . Worried About Programme researcher, broadcasting/film/video in the Last Year: Sometimes true  . Ran Out of Food in the Last Year: Never true  Transportation Needs: Unmet Transportation Needs  . Lack of Transportation (Medical): Yes  . Lack of Transportation (Non-Medical): Yes  Physical Activity: Not on file  Stress: Not on file  Social Connections: Not on file   Intimate Partner Violence: Not on file    ROS Pertinent positive and negative noted in HPI  Objective:   Today's Vitals: BP (!) 132/91 (BP Location: Right Arm, Patient Position: Sitting, Cuff Size: Large)   Pulse 79   Temp (!) 97.3 F (36.3 C) (Temporal)   Ht 5\' 5"  (1.651 m)   Wt 213 lb 12.8 oz (97 kg)   LMP 05/22/2020 (Approximate)   SpO2 97%   BMI 35.58 kg/m   Physical Exam Vitals:   06/25/20 0852  BP: (!) 132/91  Pulse: 79  Temp: (!) 97.3 F (36.3 C)  TempSrc: Temporal  SpO2: 97%  Weight: 213 lb 12.8 oz (97 kg)  Height: 5\' 5"  (1.651 m)   General: Vital signs reviewed.  Patient is well-developed and well-nourished,obese female  in no acute distress and cooperative with exam.  Head: Normocephalic and atraumatic. Eyes: EOMI, conjunctivae normal, no scleral icterus.  Neck: Supple, trachea midline, normal ROM, no JVD, masses, thyromegaly, or carotid bruit present.  Cardiovascular: RRR, S1 normal, S2 normal, no murmurs, gallops, or rubs. Pulmonary/Chest: Clear to auscultation bilaterally, no wheezes, rales, or rhonchi. Abdominal: Soft, non-tender, non-distended, BS +, no masses, organomegaly, or guarding present.  Musculoskeletal: No joint deformities, erythema, or stiffness, ROM full and nontender. Extremities: No lower extremity edema bilaterally,  pulses symmetric and intact bilaterally.  No cyanosis or clubbing. Neurological: A&O x3, Strength is normal and symmetric bilaterally, cranial nerve II-XII are grossly intact, no focal motor deficit, sensory intact to light touch bilaterally.  Skin: Warm, dry and intact. No rashes or erythema. Psychiatric: Normal mood and affect. speech and behavior is normal. Cognition and memory are normal.  Assessment & Plan:    Outpatient Encounter Medications as of 06/25/2020  Medication Sig  . amLODipine (NORVASC) 10 MG tablet Take 1 tablet (10 mg total) by mouth daily.  Marland Kitchen etonogestrel (NEXPLANON) 68 MG IMPL implant 1 each by Subdermal  route once.  . [DISCONTINUED] hydrochlorothiazide (HYDRODIURIL) 25 MG tablet Take 1 tablet (25 mg total) by mouth daily.  Marland Kitchen acetaminophen (TYLENOL) 500 MG tablet Take 500 mg by mouth every 6 (six) hours as needed for mild pain.  . hydrochlorothiazide (HYDRODIURIL) 25 MG tablet Take 1 tablet (25 mg total) by mouth daily.   No facility-administered encounter medications on file as of 06/25/2020.  Grissel was seen today for new patient (initial visit).  Diagnoses and all orders for this visit:  Hypertension, unspecified type Not controlled add amlodipine with HCTZ 25mg  daily  Counseled on blood pressure goal of less than 130/80, low-sodium, DASH diet, medication compliance, 150 minutes of moderate intensity exercise per week. Discussed medication compliance, adverse effects. -     amLODipine (NORVASC) 10 MG tablet; Take 1 tablet (10 mg total) by mouth daily.  -hydrochlorothiazide (HYDRODIURIL) 25 MG tablet; Take 1 tablet (25 mg total) by mouth daily.   Encounter to establish care Establish care with new provider   Class 2 obesity due to excess calories without serious comorbidity with body mass index (BMI) of 35.0 to 35.9 in adult Obesity is 30-39 indicating an excess in caloric intake or underlining conditions. Walks a lot.  This may lead to other co-morbidities. DM and respiratory complications. Lifestyle modifications of diet and exercise may reduce obesity.   Irregular menstrual cycle Secondary to birth control etonogestrel (NEXPLANON) 68 MG IMPL implant   Tobacco dependence   Primary hypertension -      Follow-up: Return in about 3 months (around 09/22/2020) for Bp f/u/ labs.   09/24/2020, NP

## 2020-06-25 NOTE — Patient Instructions (Signed)

## 2021-12-16 ENCOUNTER — Emergency Department (HOSPITAL_COMMUNITY)
Admission: EM | Admit: 2021-12-16 | Discharge: 2021-12-16 | Disposition: A | Discharge status: Home / Self Care | Payer: Medicaid Other | Attending: Emergency Medicine | Admitting: Emergency Medicine

## 2021-12-16 ENCOUNTER — Encounter (HOSPITAL_COMMUNITY): Payer: Self-pay | Admitting: Emergency Medicine

## 2021-12-16 ENCOUNTER — Other Ambulatory Visit: Payer: Self-pay

## 2021-12-16 DIAGNOSIS — X58XXXA Exposure to other specified factors, initial encounter: Secondary | ICD-10-CM | POA: Insufficient documentation

## 2021-12-16 DIAGNOSIS — T161XXA Foreign body in right ear, initial encounter: Secondary | ICD-10-CM | POA: Insufficient documentation

## 2021-12-16 MED ORDER — OFLOXACIN 0.3 % OT SOLN: 5.0000 [drp] | Freq: Two times a day (BID) | OTIC | 0 refills | Status: AC

## 2021-12-16 NOTE — ED Provider Notes (Signed)
[facility] Provider Note   CSN: [id number] Arrival date & time: 12/16/21  0540     History  Chief Complaint  Patient presents with   Bug in ear    Tara Parrish is a 31 y.o. female who presents with concern for insect in her right ear.  She states that woke her from her sleep this morning, tried to get it out with peroxide and a Bobby pin, unsuccessful.  No other concerns.  HPI     Home Medications Prior to Admission medications   Medication Sig Start Date End Date Taking? Authorizing Provider  ofloxacin (FLOXIN) 0.3 % OTIC solution Place 5 drops into the right ear 2 (two) times daily for 7 days. 12/16/21 12/23/21 Yes Caroly Purewal, Eugene Gavia, PA-C  acetaminophen (TYLENOL) 500 MG tablet Take 500 mg by mouth every 6 (six) hours as needed for mild pain.    [provider]  amLODipine (NORVASC) 10 MG tablet Take 1 tablet (10 mg total) by mouth daily. 06/25/20   Tara Sessions, NP  etonogestrel (NEXPLANON) 68 MG IMPL implant 1 each by Subdermal route once.    [provider]  hydrochlorothiazide (HYDRODIURIL) 25 MG tablet Take 1 tablet (25 mg total) by mouth daily. 06/25/20   Tara Sessions, NP      Allergies    Bee venom and Grass extracts [gramineae pollens]    Review of Systems   Review of Systems  HENT:         Insect in the right ear    Physical Exam Updated Vital Signs BP (!) 178/126 (BP Location: Right Arm)   Pulse 72   Temp 98.2 F (36.8 C) (Oral)   Resp 20   Wt 102.1 kg   LMP 11/25/2021 (Approximate)   SpO2 100%   BMI 37.44 kg/m  Physical Exam Vitals and nursing note reviewed.  Constitutional:      Appearance: She is not ill-appearing or toxic-appearing.  HENT:     Head: Normocephalic and atraumatic.     Right Ear: Tympanic membrane normal.     Left Ear: Tympanic membrane normal.     Ears:     Comments: Insect inside the right ear consistent with small cockroach, live and moving  about Eyes:     General: Lids are normal. Vision grossly intact. No scleral icterus.       Right eye: No discharge.        Left eye: No discharge.     Conjunctiva/sclera: Conjunctivae normal.     Pupils: Pupils are equal, round, and reactive to light.  Cardiovascular:     Heart sounds: Normal heart sounds. No murmur heard. Pulmonary:     Effort: Pulmonary effort is normal.  Skin:    General: Skin is warm and dry.  Neurological:     General: No focal deficit present.     Mental Status: She is alert.  Psychiatric:        Mood and Affect: Mood normal.     ED Results / Procedures / Treatments   Labs (all labs ordered are listed, but only abnormal results are displayed) Labs Reviewed - No data to display  EKG None  Radiology No results found.  Procedures .Foreign Body Removal  Date/Time: 12/16/2021 6:46 AM  Performed by: Paris Lore, PA-C Authorized by: Paris Lore, PA-C  Consent: Verbal consent obtained. Consent given by: patient Patient understanding: patient states understanding of the procedure being performed Patient consent: the patient's  understanding of the procedure matches consent given Procedure consent: procedure consent matches procedure scheduled Relevant documents: relevant documents present and verified Test results: test results available and properly labeled Patient identity confirmed: verbally with patient Body area: ear Location details: right ear Anesthesia method: None.  Sedation: Patient sedated: no  Localization method: ENT speculum Removal mechanism: forceps Complexity: simple 1 objects recovered. Objects recovered: Small cockroach, 1.5 cm in length Post-procedure assessment: foreign body removed Patient tolerance: patient tolerated the procedure well with no immediate complications Comments: Otic exam following removal of insect revealed excoriated right EAC, no TM perforation      Medications Ordered in  ED Medications - No data to display  ED Course/ Medical Decision Making/ A&P                           Medical Decision Making 31 year old female with insect in the right ear.  Insect identified on otic exam of the right and successfully removed as above.  Tolerated procedure well.  Excoriations of the EAC on the right, otic drops prescribed to prevent development of infection.  No further work-up warranted in the ED at this time.  Risk Prescription drug management.   [name] voiced understanding of her medical evaluation and treatment plan. Each of their questions answered to their expressed satisfaction.  Return precautions were given.  Patient is well-appearing, stable, and was discharged in good condition.  This chart was dictated using voice recognition software, Dragon. Despite the best efforts of this provider to proofread and correct errors, errors may still occur which can change documentation meaning.  Final Clinical Impression(s) / ED Diagnoses Final diagnoses:  Ear foreign body, right, initial encounter    Rx / DC Orders ED Discharge Orders          Ordered    ofloxacin (FLOXIN) 0.3 % OTIC solution  2 times daily        12/16/21 0640              Cy Bresee, Eugene Gavia, PA-C 12/16/21 0649    Palumbo, April, MD 12/16/21 [phone]

## 2021-12-16 NOTE — Discharge Instructions (Signed)
You were seen in the ER today for the insect in your ear.  This was successfully removed (yay!).  Due to the abrasions on the inside of your ear canal you have been started on eardrops.  May take these as prescribed for the week and follow-up with your primary care doctor as necessary.  Return to the ER with any severe symptoms

## 2021-12-16 NOTE — ED Triage Notes (Signed)
Pt reports she was awakened from sleep with possible bug in R ear, feels it moving. Reporting pain to ear

## 2021-12-16 NOTE — ED Notes (Signed)
Patient verbalizes understanding of discharge instructions. Opportunity for questioning and answers were provided. Armband removed by staff, pt discharged from ED. Ambulated out to lobby  

## 2022-01-01 ENCOUNTER — Encounter (HOSPITAL_COMMUNITY): Payer: Self-pay | Admitting: Emergency Medicine

## 2022-01-01 ENCOUNTER — Ambulatory Visit (HOSPITAL_COMMUNITY)
Admission: EM | Admit: 2022-01-01 | Discharge: 2022-01-01 | Disposition: A | Discharge status: Home / Self Care | Payer: Medicaid Other | Attending: Emergency Medicine | Admitting: Emergency Medicine

## 2022-01-01 DIAGNOSIS — R1032 Left lower quadrant pain: Secondary | ICD-10-CM

## 2022-01-01 MED ORDER — CYCLOBENZAPRINE HCL 10 MG PO TABS: 10.0000 mg | ORAL_TABLET | Freq: Every day | ORAL | 0 refills | Status: AC

## 2022-01-01 MED ORDER — KETOROLAC TROMETHAMINE 30 MG/ML IJ SOLN
30.0000 mg | Freq: Once | INTRAMUSCULAR | Status: AC
  Administered 2022-01-01: 30 mg via INTRAMUSCULAR

## 2022-01-01 MED ORDER — KETOROLAC TROMETHAMINE 30 MG/ML IJ SOLN
INTRAMUSCULAR | Status: AC
  Filled 2022-01-01: qty 1

## 2022-01-01 MED ORDER — MELOXICAM 7.5 MG PO TABS: 7.5000 mg | ORAL_TABLET | Freq: Every day | ORAL | 0 refills | Status: AC

## 2022-01-01 NOTE — Discharge Instructions (Signed)
On exam pain appears to be muscular or irritation to the tendon that allows for movement, this will improve with time  You have been given an injection of Toradol today here in the office to help reduce inflammation that occurs with injury which in turn will help with your pain  Starting tomorrow begin meloxicam every morning for 5 days then you may use as needed, may use Tylenol throughout the day for additional supportive care  May use muscle relaxer at bedtime, be mindful this medicine will make you drowsy, if this occurs you may take half a dose  You may use heating pad in 15 minute intervals as needed for additional comfort, or you may find comfort in using ice in 10-15 minutes over affected area  Begin stretching affected area daily for 10 minutes as tolerated to further loosen muscles   When lying down place pillow underneath and between knees for support   If pain persist after recommended treatment or reoccurs if may be beneficial to follow up with orthopedic specialist for evaluation, this doctor specializes in the bones and can manage your symptoms long-term with options such as but not limited to imaging, medications or physical therapy

## 2022-01-01 NOTE — ED Provider Notes (Signed)
[facility]    CSN: [id number] Arrival date & time: 01/01/22  9371      History   Chief Complaint Chief Complaint  Patient presents with   Leg Pain    HPI Tara Parrish is a 31 y.o. female.   Patient presents with constant left groin pain beginning 7 days ago.  Pain is described as a ache with intermittent pulsating pain, has begun to radiate down the leg.  Range of motion is intact but does elicit pain.  Has attempted use of Tylenol which provides minimal relief.  Denies precipitating event, injury or trauma but does endorse that she does a significant amount of walking and heavy lifting while at work.  Denies numbness or tingling.  Past Medical History:  Diagnosis Date   Chlamydia 11/2010   treated   Pregnancy induced hypertension    Prior pregnancy with chronic hypertension and superimposed severe preeclampsia, requiring IOL at 34 weeks 03/15/2014    Patient Active Problem List   Diagnosis Date Noted   Well woman exam with routine gynecological exam 04/24/2020   Prior pregnancy with chronic hypertension and superimposed severe preeclampsia, requiring IOL at 34 weeks 03/15/2014   Hypertension 12/10/2013    Past Surgical History:  Procedure Laterality Date   WISDOM TOOTH EXTRACTION      OB History     Gravida  3   Para  3   Term  2   Preterm  1   AB  0   Living  3      SAB  0   IAB  0   Ectopic  0   Multiple  0   Live Births  3            Home Medications    Prior to Admission medications   Medication Sig Start Date End Date Taking? Authorizing Provider  acetaminophen (TYLENOL) 500 MG tablet Take 500 mg by mouth every 6 (six) hours as needed for mild pain.    [provider]  amLODipine (NORVASC) 10 MG tablet Take 1 tablet (10 mg total) by mouth daily. 06/25/20   Grayce Sessions, NP  etonogestrel (NEXPLANON) 68 MG IMPL implant 1 each by Subdermal route once.    [provider]  hydrochlorothiazide  (HYDRODIURIL) 25 MG tablet Take 1 tablet (25 mg total) by mouth daily. 06/25/20   Grayce Sessions, NP    Family History Family History  Problem Relation Age of Onset   Kidney Stones Mother    Hypertension Maternal Grandmother     Social History Social History   Tobacco Use   Smoking status: Every Day    Packs/day: 0.50    Years: 1.00    Total pack years: 0.50    Types: Cigarettes   Smokeless tobacco: Never  Substance Use Topics   Alcohol use: No   Drug use: No     Allergies   Bee venom and Grass extracts [gramineae pollens]   Review of Systems Review of Systems  Constitutional: Negative.   Respiratory: Negative.    Cardiovascular: Negative.   Musculoskeletal:  Positive for myalgias. Negative for arthralgias, back pain, gait problem, joint swelling, neck pain and neck stiffness.  Skin: Negative.   Neurological: Negative.      Physical Exam Triage Vital Signs ED Triage Vitals  Enc Vitals Group     BP 01/01/22 0906 (!) 187/122     Pulse Rate 01/01/22 0906 78     Resp 01/01/22 0906 18  Temp 01/01/22 0906 99.1 F (37.3 C)     Temp Source 01/01/22 0906 Oral     SpO2 01/01/22 0906 99 %     Weight --      Height --      Head Circumference --      Peak Flow --      Pain Score 01/01/22 0903 5     Pain Loc --      Pain Edu? --      Excl. in GC? --    No data found.  Updated Vital Signs BP (!) 187/122 (BP Location: Right Arm)   Pulse 78   Temp 99.1 F (37.3 C) (Oral)   Resp 18   LMP 11/25/2021 (Approximate)   SpO2 99%   Visual Acuity Right Eye Distance:   Left Eye Distance:   Bilateral Distance:    Right Eye Near:   Left Eye Near:    Bilateral Near:     Physical Exam Constitutional:      Appearance: Normal appearance.  Eyes:     Extraocular Movements: Extraocular movements intact.  Pulmonary:     Effort: Pulmonary effort is normal.  Musculoskeletal:     Comments: Tenderness is present to the center of the groin extending along the  anterior thigh, 2+ femoral pulse, range of motion is intact but pain is elicited with abduction, abduction, able to bear weight to the lower extremity, no ecchymosis, swelling or deformity  Neurological:     Mental Status: She is alert and oriented to person, place, and time. Mental status is at baseline.  Psychiatric:        Mood and Affect: Mood normal.        Behavior: Behavior normal.      UC Treatments / Results  Labs (all labs ordered are listed, but only abnormal results are displayed) Labs Reviewed - No data to display  EKG   Radiology No results found.  Procedures Procedures (including critical care time)  Medications Ordered in UC Medications - No data to display  Initial Impression / Assessment and Plan / UC Course  I have reviewed the triage vital signs and the nursing notes.  Pertinent labs & imaging results that were available during my care of the patient were reviewed by me and considered in my medical decision making (see chart for details).  Left Groin pain  Etiology is most likely muscular, discussed with patient, Toradol injection given in office and prescribed meloxicam and Flexeril for outpatient use, recommended RICE, heat, massage, daily stretching and activity as tolerated, given walker referral to orthopedics if symptoms persist even past use of persistent medicine and supportive care, note given Final Clinical Impressions(s) / UC Diagnoses   Final diagnoses:  None   Discharge Instructions   None    ED Prescriptions   None    PDMP not reviewed this encounter.   Valinda Hoar, [place] 01/01/22 [phone]

## 2022-01-01 NOTE — ED Triage Notes (Signed)
Pt reports left leg pain since Monday. States she believes she may have pulled a muscle. Denies any obvious injuries and falls. 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 Has been taking tylenol for pain.

## 2023-02-23 ENCOUNTER — Ambulatory Visit
Admission: EM | Admit: 2023-02-23 | Discharge: 2023-02-23 | Disposition: A | Discharge status: Home / Self Care | Payer: Medicaid Other | Attending: Internal Medicine | Admitting: Internal Medicine

## 2023-02-23 DIAGNOSIS — I10 Essential (primary) hypertension: Secondary | ICD-10-CM

## 2023-02-23 NOTE — ED Provider Notes (Signed)
[facility]    CSN: [id number] Arrival date & time: 02/23/23  1208      History   Chief Complaint No chief complaint on file.   HPI Tara Parrish is a 32 y.o. female presents for evaluation of elevated BP.  Patient has a history of hypertension and recently reestablished with the PCP on October 17.  She had been on 25 mg hydrochlorothiazide and 5 mg of amlodipine was added to her medications.  At her PCP visit her blood pressure was 140/110.  Patient states she does not check her blood pressure regularly but did decide to check it this morning and it was 162/140 in her right arm and 146/136 in her left arm.  Patient denies any chest pain, shortness of breath, dizziness, headache, visual changes, unilateral weakness.  States she is trying to eat better and take better care of herself.  She did call her PCP on the way to the urgent care and was advised to double up on her amlodipine and hydrochlorothiazide for the next week and make a follow-up appointment.  She comes in today for a second opinion regarding this.  No other concerns at this time  HPI  Past Medical History:  Diagnosis Date   Chlamydia 11/2010   treated   Pregnancy induced hypertension    Prior pregnancy with chronic hypertension and superimposed severe preeclampsia, requiring IOL at 34 weeks 03/15/2014    Patient Active Problem List   Diagnosis Date Noted   Well woman exam with routine gynecological exam 04/24/2020   Prior pregnancy with chronic hypertension and superimposed severe preeclampsia, requiring IOL at 34 weeks 03/15/2014   Hypertension 12/10/2013    Past Surgical History:  Procedure Laterality Date   WISDOM TOOTH EXTRACTION      OB History     Gravida  3   Para  3   Term  2   Preterm  1   AB  0   Living  3      SAB  0   IAB  0   Ectopic  0   Multiple  0   Live Births  3            Home Medications    Prior to Admission medications   Medication Sig Start Date  End Date Taking? Authorizing Provider  amLODipine (NORVASC) 10 MG tablet Take 1 tablet (10 mg total) by mouth daily. 06/25/20  Yes Grayce Sessions, NP  etonogestrel (NEXPLANON) 68 MG IMPL implant 1 each by Subdermal route once.   Yes [provider]  hydrochlorothiazide (HYDRODIURIL) 25 MG tablet Take 1 tablet (25 mg total) by mouth daily. 06/25/20  Yes Grayce Sessions, NP  acetaminophen (TYLENOL) 500 MG tablet Take 500 mg by mouth every 6 (six) hours as needed for mild pain.    [provider]  cyclobenzaprine (FLEXERIL) 10 MG tablet Take 1 tablet (10 mg total) by mouth at bedtime. 01/01/22   White, Elita Boone, NP  meloxicam (MOBIC) 7.5 MG tablet Take 1 tablet (7.5 mg total) by mouth daily. 01/01/22   Valinda Hoar, NP    Family History Family History  Problem Relation Age of Onset   Kidney Stones Mother    Hypertension Maternal Grandmother     Social History Social History   Tobacco Use   Smoking status: Every Day    Current packs/day: 0.50    Average packs/day: 0.5 packs/day for 1 year (0.5 ttl pk-yrs)    Types:  Cigarettes   Smokeless tobacco: Never  Substance Use Topics   Alcohol use: No   Drug use: No     Allergies   Bee venom and Grass extracts [gramineae pollens]   Review of Systems Review of Systems  Cardiovascular:        Elevated BP     Physical Exam Triage Vital Signs ED Triage Vitals  Encounter Vitals Group     BP 02/23/23 1219 (!) 163/124     Systolic BP Percentile --      Diastolic BP Percentile --      Pulse Rate 02/23/23 1219 88     Resp 02/23/23 1219 16     Temp 02/23/23 1219 98.6 F (37 C)     Temp Source 02/23/23 1219 Oral     SpO2 02/23/23 1219 99 %     Weight --      Height --      Head Circumference --      Peak Flow --      Pain Score 02/23/23 1222 0     Pain Loc --      Pain Education --      Exclude from Growth Chart --    No data found.  Updated Vital Signs BP (!) 155/90 (BP Location: Right Arm)    Pulse 88   Temp 98.6 F (37 C) (Oral)   Resp 16   SpO2 99%   Visual Acuity Right Eye Distance:   Left Eye Distance:   Bilateral Distance:    Right Eye Near:   Left Eye Near:    Bilateral Near:     Physical Exam Vitals and nursing note reviewed.  Constitutional:      General: She is not in acute distress.    Appearance: Normal appearance. She is obese. She is not ill-appearing, toxic-appearing or diaphoretic.  HENT:     Head: Normocephalic and atraumatic.  Eyes:     Pupils: Pupils are equal, round, and reactive to light.  Cardiovascular:     Rate and Rhythm: Normal rate and regular rhythm.     Heart sounds: Normal heart sounds.  Pulmonary:     Effort: Pulmonary effort is normal.     Breath sounds: Normal breath sounds.  Skin:    General: Skin is warm and dry.  Neurological:     General: No focal deficit present.     Mental Status: She is alert and oriented to person, place, and time.  Psychiatric:        Mood and Affect: Mood normal.        Behavior: Behavior normal.      UC Treatments / Results  Labs (all labs ordered are listed, but only abnormal results are displayed) Labs Reviewed - No data to display  EKG   Radiology No results found.  Procedures ED EKG  Date/Time: 02/23/2023 1:01 PM  Performed by: Radford Pax, NP Authorized by: Radford Pax, NP   ECG interpreted by ED Physician in the absence of a cardiologist: no   Previous ECG:    Previous ECG:  Unavailable Interpretation:    Interpretation: normal   Rate:    ECG rate:  67   ECG rate assessment: normal   Rhythm:    Rhythm: sinus rhythm   Ectopy:    Ectopy: none   QRS:    QRS axis:  Normal ST segments:    ST segments:  Normal T waves:    T waves: normal    (  including critical care time)  Medications Ordered in UC Medications - No data to display  Initial Impression / Assessment and Plan / UC Course  I have reviewed the triage vital signs and the nursing notes.  Pertinent  labs & imaging results that were available during my care of the patient were reviewed by me and considered in my medical decision making (see chart for details).     Reviewed exam and symptoms with patient.  No red flags.  EKG unremarkable.  Blood pressure came down to 155/90 on manual recheck.  Discussed increasing current blood pressure medications for better BP control.  She does not want to increase her amlodipine at this time as she feels she is having some muscle cramping secondary to this.  She is comfortable increasing her hydrochlorothiazide from 25 mg daily to 50 mg daily.  She was encouraged to continue a BP log taking her blood pressure twice daily for the next couple weeks and take this to her PCP for evaluation.  She should also follow-up with her PCP within 1 week for recheck.  Discussed DASH diet.  ER precautions reviewed and patient verbalized understanding. Final Clinical Impressions(s) / UC Diagnoses   Final diagnoses:  Hypertension, unspecified type     Discharge Instructions      Continue to keep a blood pressure log taking her BP twice daily and take this to your primary care.  You can increase her hydrochlorothiazide from 25 mg daily to 50 mg daily.  You can also increase your amlodipine from 5 mg daily to 10 mg daily.  Monitor your salt intake.  Please follow-up with your PCP in the next week for further evaluation of your blood pressure.  Please go to the ER if you develop any worsening symptoms.  This includes but is not limited to headache, chest pain, visual changes, shortness of breath, dizziness, or any new concerns that arise.  I hope you feel better soon!     ED Prescriptions   None    PDMP not reviewed this encounter.   Radford Pax, NP 02/23/23 [phone]

## 2023-02-23 NOTE — ED Triage Notes (Signed)
Pt presents to UC w/ c/o elevated blood pressure this morning. Her home readings were 162/140 in the right arm and 146/136 in the left arm. Pt called PCP "on the way here" who states that she should "double up on her BP meds." Pt states she came here because she "wanted a second opinion."

## 2023-02-23 NOTE — Discharge Instructions (Signed)
Continue to keep a blood pressure log taking her BP twice daily and take this to your primary care.  You can increase her hydrochlorothiazide from 25 mg daily to 50 mg daily.  You can also increase your amlodipine from 5 mg daily to 10 mg daily.  Monitor your salt intake.  Please follow-up with your PCP in the next week for further evaluation of your blood pressure.  Please go to the ER if you develop any worsening symptoms.  This includes but is not limited to headache, chest pain, visual changes, shortness of breath, dizziness, or any new concerns that arise.  I hope you feel better soon!

## 2023-12-20 ENCOUNTER — Emergency Department (HOSPITAL_COMMUNITY)

## 2023-12-20 ENCOUNTER — Other Ambulatory Visit: Payer: Self-pay

## 2023-12-20 ENCOUNTER — Emergency Department (HOSPITAL_COMMUNITY)
Admission: EM | Admit: 2023-12-20 | Discharge: 2023-12-20 | Disposition: A | Discharge status: Home / Self Care | Attending: Emergency Medicine | Admitting: Emergency Medicine

## 2023-12-20 DIAGNOSIS — S92121A Displaced fracture of body of right talus, initial encounter for closed fracture: Secondary | ICD-10-CM | POA: Insufficient documentation

## 2023-12-20 DIAGNOSIS — M25571 Pain in right ankle and joints of right foot: Secondary | ICD-10-CM | POA: Diagnosis present

## 2023-12-20 DIAGNOSIS — W130XXA Fall from, out of or through balcony, initial encounter: Secondary | ICD-10-CM | POA: Insufficient documentation

## 2023-12-20 LAB — POC URINE PREG, ED: Preg Test, Ur: NEGATIVE

## 2023-12-20 MED ORDER — IBUPROFEN 800 MG PO TABS: 800.0000 mg | ORAL_TABLET | Freq: Three times a day (TID) | ORAL | 0 refills | Status: AC | PRN

## 2023-12-20 MED ORDER — HYDROCODONE-ACETAMINOPHEN 5-325 MG PO TABS: 1.0000 | ORAL_TABLET | ORAL | 0 refills | Status: DC | PRN

## 2023-12-20 MED ORDER — IBUPROFEN 800 MG PO TABS: 800.0000 mg | ORAL_TABLET | Freq: Three times a day (TID) | ORAL | 0 refills | Status: DC | PRN

## 2023-12-20 MED ORDER — HYDROCODONE-ACETAMINOPHEN 5-325 MG PO TABS: 1.0000 | ORAL_TABLET | ORAL | 0 refills | Status: AC | PRN

## 2023-12-20 NOTE — ED Provider Notes (Signed)
 [place] EMERGENCY DEPARTMENT AT [facility] Provider Note   CSN: [id number] Arrival date & time: 12/20/23  9184     Patient presents with: Fall and Ankle Pain   Tara Parrish is a 33 y.o. female.   Patient reports she fell off of her friend's porch yesterday.  Patient complains of swelling and pain to her right ankle.  Patient complains of difficulty walking.  Patient reports swelling has increased today.  Patient reports she is having difficulty walking she is unable to tolerate weightbearing.  Patient denies any other area of injuries.  She denies any numbness or tingling.  The history is provided by the patient. No language interpreter was used.  Fall This is a new problem. The current episode started yesterday. The problem occurs constantly. The problem has been gradually worsening. Nothing aggravates the symptoms. Nothing relieves the symptoms. She has tried nothing for the symptoms.  Ankle Pain      Prior to Admission medications   Medication Sig Start Date End Date Taking? Authorizing Provider  acetaminophen  (TYLENOL ) 500 MG tablet Take 500 mg by mouth every 6 (six) hours as needed for mild pain.    [provider]  amLODipine  (NORVASC ) 10 MG tablet Take 1 tablet (10 mg total) by mouth daily. 06/25/20   Celestia Rosaline SQUIBB, NP  cyclobenzaprine  (FLEXERIL ) 10 MG tablet Take 1 tablet (10 mg total) by mouth at bedtime. 01/01/22   White, Shelba SAUNDERS, NP  etonogestrel  (NEXPLANON ) 68 MG IMPL implant 1 each by Subdermal route once.    [provider]  hydrochlorothiazide  (HYDRODIURIL ) 25 MG tablet Take 1 tablet (25 mg total) by mouth daily. 06/25/20   Celestia Rosaline SQUIBB, NP  meloxicam  (MOBIC ) 7.5 MG tablet Take 1 tablet (7.5 mg total) by mouth daily. 01/01/22   Teresa Shelba SAUNDERS, NP    Allergies: Bee venom and Grass extracts [gramineae pollens]    Review of Systems  All other systems reviewed and are negative.   Updated Vital Signs BP (!) 163/120 (BP  Location: Right Arm)   Pulse (!) 112   Temp 98.9 F (37.2 C) (Oral)   Resp 18   Ht 5' 5 (1.651 m)   Wt 108.9 kg   LMP 11/22/2023   SpO2 98%   BMI 39.94 kg/m   Physical Exam Vitals reviewed.  Constitutional:      Appearance: Normal appearance.  Cardiovascular:     Rate and Rhythm: Normal rate.  Pulmonary:     Effort: Pulmonary effort is normal.  Musculoskeletal:        General: Swelling and tenderness present.  Skin:    General: Skin is warm.  Neurological:     General: No focal deficit present.     Mental Status: She is alert.     (all labs ordered are listed, but only abnormal results are displayed) Labs Reviewed  POC URINE PREG, ED    EKG: None  Radiology: DG Ankle Complete Right Result Date: 12/20/2023 EXAM: 3 VIEW(S) XRAY OF THE RIGHT ANKLE 12/20/2023 08:48:31 AM CLINICAL HISTORY: Fall. Fell today. Pain medial and lateral aspect, swelling lateral malleolus right ankle. COMPARISON: None available. FINDINGS: BONES AND JOINTS: There appears to be a mildly displaced fracture of the medial aspect of the talus seen on the AP view. SOFT TISSUES: There is moderate lateral soft tissue swelling. IMPRESSION: 1. Suspect mildly displaced fracture of the medial aspect of the talus. 2. Moderate lateral soft tissue swelling. Electronically signed by: Evalene Coho MD 12/20/2023 09:11  AM EDT RP Workstation: [id number]     Procedures   Medications Ordered in the ED - No data to display                                  Medical Decision Making Patient complains of swelling and pain to her right ankle.  Patient reports twisting her ankle.  Patient unable to tolerate weightbearing  Amount and/or Complexity of Data Reviewed Radiology: ordered and independent interpretation performed. Decision-making details documented in ED Course.    Details: X-ray right ankle shows mildly displaced fracture medial aspect of talus  Risk Prescription drug management. Risk Details: Patient  counseled on results.  Patient is advised to follow-up with Dr. Sharl orthopedist for recheck.  Patient is advised ice elevation.  Patient is placed in a cam walker and given crutches.  Patient has a history of hypertension she has not taken her medications today.  Patient is advised her blood pressure is elevated she needs to take her medications.        Final diagnoses:  Closed displaced fracture of body of right talus, initial encounter    ED Discharge Orders          Ordered    ibuprofen  (ADVIL ) 800 MG tablet  Every 8 hours PRN        12/20/23 0937    HYDROcodone -acetaminophen  (NORCO/VICODIN) 5-325 MG tablet  Every 4 hours PRN,   Status:  Discontinued        12/20/23 0937    HYDROcodone -acetaminophen  (NORCO/VICODIN) 5-325 MG tablet  Every 4 hours PRN        12/20/23 0939    ibuprofen  (ADVIL ) 800 MG tablet  Every 8 hours PRN        12/20/23 9060           An After Visit Summary was printed and given to the patient.     Flint Sonny POUR, [place] 12/20/23 9060    Garrick Charleston, MD 12/20/23 1122

## 2023-12-20 NOTE — Progress Notes (Signed)
 Orthopedic Tech Progress Note Patient Details:  Tara Parrish February 02, 1991 [id number]  Ortho Devices Type of Ortho Device: Crutches, CAM walker Ortho Device/Splint Location: RLE Ortho Device/Splint Interventions: Ordered, Application, Adjustment   Post Interventions Patient Tolerated: Well, Ambulated well Instructions Provided: Poper ambulation with device, Care of device  Delanna LITTIE Pac 12/20/2023, 10:01 AM

## 2023-12-20 NOTE — ED Notes (Signed)
 Ortho tech called

## 2023-12-20 NOTE — ED Notes (Signed)
 Went to DC patient, she had already left.

## 2023-12-20 NOTE — ED Triage Notes (Signed)
 Pt. Stated, coming off my neighbors steps and I went to right and my foot stayed put. I lost my balance. Right ankle swollen. Hurts to put pressure on my foot

## 2023-12-20 NOTE — ED Notes (Signed)
 Patient transported to X-ray

## 2023-12-20 NOTE — Discharge Instructions (Signed)
 Return if any problems.
